# Patient Record
Sex: Female | Born: 1990 | Hispanic: Yes | Marital: Married | Smoking: Never smoker
Health system: Southern US, Community
[De-identification: ages and names within clinical notes are randomized; demographics above are authoritative.]

## PROBLEM LIST (undated history)

## (undated) DIAGNOSIS — D126 Benign neoplasm of colon, unspecified: Secondary | ICD-10-CM

## (undated) DIAGNOSIS — J329 Chronic sinusitis, unspecified: Secondary | ICD-10-CM

## (undated) DIAGNOSIS — F419 Anxiety disorder, unspecified: Secondary | ICD-10-CM

## (undated) DIAGNOSIS — J302 Other seasonal allergic rhinitis: Secondary | ICD-10-CM

## (undated) DIAGNOSIS — J45909 Unspecified asthma, uncomplicated: Secondary | ICD-10-CM

## (undated) DIAGNOSIS — B9681 Helicobacter pylori [H. pylori] as the cause of diseases classified elsewhere: Secondary | ICD-10-CM

## (undated) DIAGNOSIS — K219 Gastro-esophageal reflux disease without esophagitis: Secondary | ICD-10-CM

## (undated) DIAGNOSIS — T7840XA Allergy, unspecified, initial encounter: Secondary | ICD-10-CM

## (undated) HISTORY — PX: TONSILLECTOMY: SUR1361

## (undated) HISTORY — DX: Allergy, unspecified, initial encounter: T78.40XA

## (undated) HISTORY — DX: Benign neoplasm of colon, unspecified: D12.6

## (undated) HISTORY — DX: Anxiety disorder, unspecified: F41.9

## (undated) HISTORY — PX: POLYPECTOMY: SHX149

## (undated) HISTORY — DX: Helicobacter pylori (H. pylori) as the cause of diseases classified elsewhere: B96.81

## (undated) HISTORY — PX: COLONOSCOPY: SHX174

---
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
## 2011-01-27 ENCOUNTER — Encounter: Payer: Self-pay | Admitting: *Deleted

## 2011-01-27 ENCOUNTER — Emergency Department (INDEPENDENT_AMBULATORY_CARE_PROVIDER_SITE_OTHER)
Admission: EM | Admit: 2011-01-27 | Discharge: 2011-01-27 | Disposition: A | Discharge status: Home / Self Care | Payer: Medicaid Other | Source: Home / Self Care | Attending: Emergency Medicine | Admitting: Emergency Medicine

## 2011-01-27 DIAGNOSIS — H6690 Otitis media, unspecified, unspecified ear: Secondary | ICD-10-CM

## 2011-01-27 HISTORY — DX: Other seasonal allergic rhinitis: J30.2

## 2011-01-27 HISTORY — DX: Chronic sinusitis, unspecified: J32.9

## 2011-01-27 MED ORDER — AMOXICILLIN 500 MG PO CAPS: 1000.0000 mg | ORAL_CAPSULE | Freq: Three times a day (TID) | ORAL | Status: AC

## 2011-01-27 MED ORDER — IBUPROFEN 600 MG PO TABS: 600.0000 mg | ORAL_TABLET | Freq: Four times a day (QID) | ORAL | Status: AC | PRN

## 2011-01-27 MED ORDER — ANTIPYRINE-BENZOCAINE 5.4-1.4 % OT SOLN: 3.0000 [drp] | Freq: Four times a day (QID) | OTIC | Status: AC | PRN

## 2011-01-27 NOTE — ED Provider Notes (Signed)
History     CSN: [id number]  Arrival date & time 01/27/11  1738   First MD Initiated Contact with Patient 01/27/11 1855      Chief Complaint  Patient presents with  . Otalgia  . Nasal Congestion    (Consider location/radiation/quality/duration/timing/severity/associated sxs/prior treatment) Patient is a 21 y.o. female presenting with ear pain. The history is provided by the patient. No language interpreter was used.  Otalgia This is a new problem. The current episode started more than 2 days ago. There is pain in the left ear. The problem occurs constantly. The problem has been gradually worsening. The maximum temperature recorded prior to her arrival was 100 to 100.9 F. The fever has been present for less than 1 day. Associated symptoms include hearing loss, rhinorrhea and sore throat. Pertinent negatives include no ear discharge, no headaches, no abdominal pain, no diarrhea, no vomiting, no neck pain, no cough and no rash.    Past Medical History  Diagnosis Date  . Seasonal allergies   . Sinusitis     Past Surgical History  Procedure Date  . Cesarean section     History reviewed. No pertinent family history.  History  Substance Use Topics  . Smoking status: Never Smoker   . Smokeless tobacco: Not on file  . Alcohol Use: No    OB History    Grav Para Term Preterm Abortions TAB SAB Ect Mult Living                  Review of Systems  HENT: Positive for hearing loss, ear pain, sore throat and rhinorrhea. Negative for neck pain and ear discharge.   Respiratory: Negative for cough.   Gastrointestinal: Negative for vomiting, abdominal pain and diarrhea.  Skin: Negative for rash.  Neurological: Negative for headaches.    Allergies  Latex  Home Medications   Current Outpatient Rx  Name Route Sig Dispense Refill  . ACETAMINOPHEN 500 MG PO TABS Oral Take 500 mg by mouth every 6 (six) hours as needed.      Marland Kitchen PSEUDOEPHEDRINE HCL 30 MG PO TABS Oral Take 30 mg by  mouth every 4 (four) hours as needed.      . AMOXICILLIN 500 MG PO CAPS Oral Take 2 capsules (1,000 mg total) by mouth 3 (three) times daily. X 10 days 30 capsule 0  . ANTIPYRINE-BENZOCAINE 5.4-1.4 % OT SOLN Left Ear Place 3 drops into the left ear 4 (four) times daily as needed for pain. 10 mL 0  . IBUPROFEN 600 MG PO TABS Oral Take 1 tablet (600 mg total) by mouth every 6 (six) hours as needed for pain. 30 tablet 0    BP 142/68  Pulse 110  Temp(Src) 100.1 F (37.8 C) (Oral)  Resp 19  SpO2 100%  LMP 01/24/2011  Physical Exam  Nursing note and vitals reviewed. Constitutional: She is oriented to person, place, and time. She appears well-developed and well-nourished.  HENT:  Head: Normocephalic and atraumatic.  Right Ear: Ear canal normal. Tympanic membrane is bulging. No middle ear effusion.  Left Ear: There is tenderness. Tympanic membrane is erythematous and bulging. A middle ear effusion is present.  Nose: Mucosal edema and rhinorrhea present. No epistaxis.  Mouth/Throat: Uvula is midline, oropharynx is clear and moist and mucous membranes are normal.       (-) frontal, maxillary sinus tenderness  Eyes: Conjunctivae and EOM are normal. Pupils are equal, round, and reactive to light.  Neck: Normal range of motion. Neck  supple.  Cardiovascular: Normal rate, regular rhythm and normal heart sounds.   Pulmonary/Chest: Effort normal and breath sounds normal. No respiratory distress. She has no wheezes. She has no rales.  Abdominal: She exhibits no distension. There is no tenderness. There is no rebound and no guarding.  Musculoskeletal: Normal range of motion.  Lymphadenopathy:    She has cervical adenopathy.       Shotty LN bilaterally  Neurological: She is alert and oriented to person, place, and time.  Skin: Skin is warm and dry. No rash noted.  Psychiatric: She has a normal mood and affect. Her behavior is normal. Judgment and thought content normal.    ED Course  Procedures  (including critical care time)  Labs Reviewed - No data to display No results found.   1. Otitis media       MDM    Luiz Blare, MD 01/31/11 [phone]

## 2011-01-27 NOTE — ED Notes (Signed)
C/O nasal congestion x 5 days.  Started w/ left earache this AM.  Has been taking OTC congestion med without relief.

## 2011-03-28 ENCOUNTER — Emergency Department (HOSPITAL_COMMUNITY)
Admission: EM | Admit: 2011-03-28 | Discharge: 2011-03-29 | Disposition: A | Discharge status: Home / Self Care | Payer: Medicaid Other | Attending: Emergency Medicine | Admitting: Emergency Medicine

## 2011-03-28 ENCOUNTER — Encounter (HOSPITAL_COMMUNITY): Payer: Self-pay | Admitting: Emergency Medicine

## 2011-03-28 DIAGNOSIS — R7402 Elevation of levels of lactic acid dehydrogenase (LDH): Secondary | ICD-10-CM | POA: Insufficient documentation

## 2011-03-28 DIAGNOSIS — R7401 Elevation of levels of liver transaminase levels: Secondary | ICD-10-CM | POA: Insufficient documentation

## 2011-03-28 DIAGNOSIS — R110 Nausea: Secondary | ICD-10-CM | POA: Insufficient documentation

## 2011-03-28 DIAGNOSIS — R1013 Epigastric pain: Secondary | ICD-10-CM | POA: Insufficient documentation

## 2011-03-28 DIAGNOSIS — R748 Abnormal levels of other serum enzymes: Secondary | ICD-10-CM

## 2011-03-28 NOTE — ED Notes (Signed)
Patient complaining of epigastric/mid abdominal pain that started around 2000 this night.  Patient reports nausea and vomiting; denies diarrhea.  Last emesis 2200.  Patient states that the pain radiates to mid back.

## 2011-03-29 ENCOUNTER — Emergency Department (HOSPITAL_COMMUNITY): Payer: Medicaid Other

## 2011-03-29 LAB — DIFFERENTIAL
Basophils Absolute: 0 10*3/uL (ref 0.0–0.1)
Basophils Relative: 0 % (ref 0–1)
Eosinophils Absolute: 0 10*3/uL (ref 0.0–0.7)
Eosinophils Relative: 0 % (ref 0–5)
Lymphocytes Relative: 20 % (ref 12–46)
Lymphs Abs: 2.9 10*3/uL (ref 0.7–4.0)
Monocytes Absolute: 0.7 10*3/uL (ref 0.1–1.0)
Monocytes Relative: 5 % (ref 3–12)
Neutro Abs: 10.7 10*3/uL — ABNORMAL HIGH (ref 1.7–7.7)
Neutrophils Relative %: 75 % (ref 43–77)

## 2011-03-29 LAB — CBC
HCT: 40.3 % (ref 36.0–46.0)
Hemoglobin: 14.5 g/dL (ref 12.0–15.0)
MCH: 30 pg (ref 26.0–34.0)
MCHC: 36 g/dL (ref 30.0–36.0)
MCV: 83.4 fL (ref 78.0–100.0)
Platelets: 239 10*3/uL (ref 150–400)
RBC: 4.83 MIL/uL (ref 3.87–5.11)
RDW: 12.3 % (ref 11.5–15.5)
WBC: 14.3 10*3/uL — ABNORMAL HIGH (ref 4.0–10.5)

## 2011-03-29 LAB — COMPREHENSIVE METABOLIC PANEL
ALT: 320 U/L — ABNORMAL HIGH (ref 0–35)
AST: 446 U/L — ABNORMAL HIGH (ref 0–37)
Albumin: 4.4 g/dL (ref 3.5–5.2)
Alkaline Phosphatase: 82 U/L (ref 39–117)
BUN: 11 mg/dL (ref 6–23)
CO2: 29 mEq/L (ref 19–32)
Calcium: 10 mg/dL (ref 8.4–10.5)
Chloride: 101 mEq/L (ref 96–112)
Creatinine, Ser: 0.66 mg/dL (ref 0.50–1.10)
GFR calc Af Amer: >90 mL/min (ref >90)
GFR calc non Af Amer: >90 mL/min (ref >90)
Glucose, Bld: 143 mg/dL — ABNORMAL HIGH (ref 70–99)
Potassium: 3.3 mEq/L — ABNORMAL LOW (ref 3.5–5.1)
Sodium: 141 mEq/L (ref 135–145)
Total Bilirubin: 0.7 mg/dL (ref 0.3–1.2)
Total Protein: 8 g/dL (ref 6.0–8.3)

## 2011-03-29 LAB — URINE MICROSCOPIC-ADD ON

## 2011-03-29 LAB — POCT PREGNANCY, URINE: Preg Test, Ur: NEGATIVE

## 2011-03-29 LAB — URINALYSIS, ROUTINE W REFLEX MICROSCOPIC
Bilirubin Urine: NEGATIVE
Glucose, UA: NEGATIVE mg/dL
Hgb urine dipstick: NEGATIVE
Ketones, ur: NEGATIVE mg/dL
Leukocytes, UA: NEGATIVE
Nitrite: NEGATIVE
Protein, ur: 100 mg/dL — AB
Specific Gravity, Urine: 1.024 (ref 1.005–1.030)
Urobilinogen, UA: 1 mg/dL (ref 0.0–1.0)
pH: 8.5 — ABNORMAL HIGH (ref 5.0–8.0)

## 2011-03-29 LAB — LIPASE, BLOOD: Lipase: 32 U/L (ref 11–59)

## 2011-03-29 LAB — PREGNANCY, URINE: Preg Test, Ur: NEGATIVE

## 2011-03-29 MED ORDER — HYDROCODONE-ACETAMINOPHEN 5-500 MG PO TABS: 1.0000 | ORAL_TABLET | Freq: Four times a day (QID) | ORAL | Status: AC | PRN

## 2011-03-29 MED ORDER — ONDANSETRON HCL 4 MG/2ML IJ SOLN
4.0000 mg | Freq: Once | INTRAMUSCULAR | Status: AC
  Administered 2011-03-29: 4 mg via INTRAVENOUS

## 2011-03-29 MED ORDER — ONDANSETRON HCL 4 MG/2ML IJ SOLN
INTRAMUSCULAR | Status: AC
  Filled 2011-03-29: qty 2

## 2011-03-29 MED ORDER — SODIUM CHLORIDE 0.9 % IV SOLN
Freq: Once | INTRAVENOUS | Status: AC
  Administered 2011-03-29: 1000 mL via INTRAVENOUS

## 2011-03-29 MED ORDER — GI COCKTAIL ~~LOC~~
30.0000 mL | Freq: Once | ORAL | Status: DC
  Filled 2011-03-29: qty 30

## 2011-03-29 MED ORDER — MORPHINE SULFATE 4 MG/ML IJ SOLN
4.0000 mg | Freq: Once | INTRAMUSCULAR | Status: AC
  Administered 2011-03-29: 4 mg via INTRAVENOUS
  Filled 2011-03-29: qty 1

## 2011-03-29 MED ORDER — PROMETHAZINE HCL 25 MG PO TABS: 25.0000 mg | ORAL_TABLET | Freq: Four times a day (QID) | ORAL | Status: DC | PRN

## 2011-03-29 NOTE — ED Provider Notes (Signed)
History     CSN: [id number]  Arrival date & time 03/28/11  2345   First MD Initiated Contact with Patient 03/29/11 0001      Chief Complaint  Patient presents with  . Abdominal Pain    (Consider location/radiation/quality/duration/timing/severity/associated sxs/prior treatment) HPI Comments: Started at 8PM with epigastric pain, nausea.  No vomiting or fevers.  Had similar episode last week which resolved with vomiting.  Patient is a 21 y.o. female presenting with abdominal pain. The history is provided by the patient.  Abdominal Pain The primary symptoms of the illness include abdominal pain and nausea. The primary symptoms of the illness do not include fever, vomiting, diarrhea, dysuria, vaginal discharge or vaginal bleeding. The onset of the illness was sudden. The problem has not changed since onset. The patient states that she believes she is currently not pregnant. The patient has not had a change in bowel habit. Symptoms associated with the illness do not include chills, heartburn, urgency or frequency.    Past Medical History  Diagnosis Date  . Seasonal allergies   . Sinusitis     Past Surgical History  Procedure Date  . Cesarean section     History reviewed. No pertinent family history.  History  Substance Use Topics  . Smoking status: Never Smoker   . Smokeless tobacco: Not on file  . Alcohol Use: No    OB History    Grav Para Term Preterm Abortions TAB SAB Ect Mult Living                  Review of Systems  Constitutional: Negative for fever and chills.  Gastrointestinal: Positive for nausea and abdominal pain. Negative for heartburn, vomiting and diarrhea.  Genitourinary: Negative for dysuria, urgency, frequency, vaginal bleeding and vaginal discharge.  All other systems reviewed and are negative.    Allergies  Food and Latex  Home Medications  No current outpatient prescriptions on file.  BP 132/75  Pulse 79  Temp(Src) 97.7 F (36.5 C)  (Oral)  Resp 18  SpO2 98%  LMP 03/10/2011  Physical Exam  Nursing note and vitals reviewed. Constitutional: She is oriented to person, place, and time. She appears well-developed and well-nourished. No distress.  HENT:  Head: Normocephalic and atraumatic.  Neck: Normal range of motion. Neck supple.  Cardiovascular: Normal rate and regular rhythm.  Exam reveals no gallop and no friction rub.   No murmur heard. Pulmonary/Chest: Effort normal and breath sounds normal. No respiratory distress. She has no wheezes.  Abdominal: Soft. Bowel sounds are normal. She exhibits no distension. There is tenderness.       There is mild ttp in the epigastrium with no rebound or guarding.    Musculoskeletal: Normal range of motion.  Neurological: She is alert and oriented to person, place, and time.  Skin: Skin is warm and dry. She is not diaphoretic.    ED Course  Procedures (including critical care time)   Labs Reviewed  CBC  DIFFERENTIAL  COMPREHENSIVE METABOLIC PANEL  LIPASE, BLOOD  URINALYSIS, ROUTINE W REFLEX MICROSCOPIC  PREGNANCY, URINE   No results found.   No diagnosis found.    MDM  The workup reveals elevated liver enzymes of unknown significance.  The ultrasound does not show a stone and she adamantly denies having started any new medicines.  She feels better with meds given.  I don't believe there is any emergent cause for admission.  I will discharge her with pain meds, time, and follow up with  her pcp.          Geoffery Lyons, MD 03/29/11 [phone]

## 2011-09-10 LAB — OB RESULTS CONSOLE GC/CHLAMYDIA
Chlamydia: NEGATIVE
Gonorrhea: NEGATIVE

## 2011-09-10 LAB — OB RESULTS CONSOLE HEPATITIS B SURFACE ANTIGEN: Hepatitis B Surface Ag: NEGATIVE

## 2011-09-10 LAB — OB RESULTS CONSOLE ABO/RH: RH Type: POSITIVE

## 2011-09-10 LAB — OB RESULTS CONSOLE HIV ANTIBODY (ROUTINE TESTING): HIV: NONREACTIVE

## 2011-09-10 LAB — OB RESULTS CONSOLE RUBELLA ANTIBODY, IGM: Rubella: UNDETERMINED

## 2011-09-10 LAB — OB RESULTS CONSOLE ANTIBODY SCREEN: Antibody Screen: NEGATIVE

## 2011-09-10 LAB — OB RESULTS CONSOLE RPR: RPR: NONREACTIVE

## 2012-01-24 ENCOUNTER — Encounter (HOSPITAL_COMMUNITY): Payer: Self-pay | Admitting: Emergency Medicine

## 2012-01-24 ENCOUNTER — Emergency Department (INDEPENDENT_AMBULATORY_CARE_PROVIDER_SITE_OTHER)
Admission: EM | Admit: 2012-01-24 | Discharge: 2012-01-24 | Disposition: A | Discharge status: Home / Self Care | Payer: Medicaid Other | Source: Home / Self Care | Attending: Emergency Medicine | Admitting: Emergency Medicine

## 2012-01-24 DIAGNOSIS — H6692 Otitis media, unspecified, left ear: Secondary | ICD-10-CM

## 2012-01-24 DIAGNOSIS — J45909 Unspecified asthma, uncomplicated: Secondary | ICD-10-CM

## 2012-01-24 DIAGNOSIS — H6690 Otitis media, unspecified, unspecified ear: Secondary | ICD-10-CM

## 2012-01-24 DIAGNOSIS — J069 Acute upper respiratory infection, unspecified: Secondary | ICD-10-CM

## 2012-01-24 DIAGNOSIS — Z3490 Encounter for supervision of normal pregnancy, unspecified, unspecified trimester: Secondary | ICD-10-CM

## 2012-01-24 MED ORDER — ALBUTEROL SULFATE (5 MG/ML) 0.5% IN NEBU
INHALATION_SOLUTION | RESPIRATORY_TRACT | Status: AC
  Filled 2012-01-24: qty 0.5

## 2012-01-24 MED ORDER — AMOXICILLIN 500 MG PO CAPS: 1000.0000 mg | ORAL_CAPSULE | Freq: Two times a day (BID) | ORAL | Status: DC

## 2012-01-24 MED ORDER — ALBUTEROL SULFATE (5 MG/ML) 0.5% IN NEBU
2.5000 mg | INHALATION_SOLUTION | Freq: Once | RESPIRATORY_TRACT | Status: AC
  Administered 2012-01-24: 2.5 mg via RESPIRATORY_TRACT

## 2012-01-24 MED ORDER — ALBUTEROL SULFATE HFA 108 (90 BASE) MCG/ACT IN AERS: 1.0000 | INHALATION_SPRAY | Freq: Four times a day (QID) | RESPIRATORY_TRACT | Status: DC | PRN

## 2012-01-24 NOTE — ED Notes (Signed)
Pt c//o productive cough with yellow/green sputum and back pain from coughing. Bilateral ear pain, denies drainage. Pt denies any other symptoms. Pt is currently pregnant.  Sinus congestion. -pt has not tried any otc meds for treatment.

## 2012-01-24 NOTE — ED Provider Notes (Signed)
History     CSN: [id number]  Arrival date & time 01/24/12  1348   First MD Initiated Contact with Patient 01/24/12 1543      Chief Complaint  Patient presents with  . URI    bilateral ear pain productive cough with yellow/green sputum. back pain with cough.     (Consider location/radiation/quality/duration/timing/severity/associated sxs/prior treatment) HPI Comments: 21 year old female who is 7 months pregnant complains of ears being stopped up and pain in the left ear especially with coughing. When she does cough it makes her throat hurt. She has PND but no fever or vomiting. Her chief complaint is cough and left earache. Denies shortness of breath or chest pain or abdominal pain. Denies pelvic cramps, pain or vaginal bleeding the   Past Medical History  Diagnosis Date  . Seasonal allergies   . Sinusitis     Past Surgical History  Procedure Date  . Cesarean section     History reviewed. No pertinent family history.  History  Substance Use Topics  . Smoking status: Never Smoker   . Smokeless tobacco: Not on file  . Alcohol Use: No    OB History    Grav Para Term Preterm Abortions TAB SAB Ect Mult Living   1               Review of Systems  Constitutional: Negative for fever, chills, activity change, appetite change and fatigue.  HENT: Positive for ear pain, congestion, rhinorrhea and postnasal drip. Negative for facial swelling, neck pain and neck stiffness.   Eyes: Negative.   Respiratory: Positive for cough.   Cardiovascular: Negative.   Gastrointestinal: Negative.   Skin: Negative for pallor and rash.  Neurological: Negative.   Psychiatric/Behavioral: Negative.     Allergies  Food and Latex  Home Medications   Current Outpatient Rx  Name  Route  Sig  Dispense  Refill  . VITAMINS C E PO   Oral   Take by mouth.         . ALBUTEROL SULFATE HFA 108 (90 BASE) MCG/ACT IN AERS   Inhalation   Inhale 1-2 puffs into the lungs every 6 (six) hours as  needed for wheezing.   1 Inhaler   0   . AMOXICILLIN 500 MG PO CAPS   Oral   Take 2 capsules (1,000 mg total) by mouth 2 (two) times daily.   40 capsule   0   . PROMETHAZINE HCL 25 MG PO TABS   Oral   Take 1 tablet (25 mg total) by mouth every 6 (six) hours as needed for nausea.   10 tablet   0     BP 134/75  Pulse 94  Temp 98 F (36.7 C) (Oral)  Resp 19  SpO2 96%  LMP 03/10/2011  Physical Exam  Nursing note and vitals reviewed. Constitutional: She is oriented to person, place, and time. She appears well-developed and well-nourished. No distress.  HENT:       Right TM normal; left TM with erythema and bulging. Oropharynx with mild erythema, clear PND, no exudates.  Eyes: Conjunctivae normal and EOM are normal.  Neck: Normal range of motion. Neck supple.  Cardiovascular: Normal rate and regular rhythm.   Pulmonary/Chest: Effort normal. No respiratory distress. She has wheezes.       Wheezes are not heard with normal respirations at rest. With deep breaths and cough and expiratory wheezes produced. No Rales or rhonchi.  Musculoskeletal: Normal range of motion. She exhibits no edema.  Lymphadenopathy:    She has no cervical adenopathy.  Neurological: She is alert and oriented to person, place, and time. She exhibits normal muscle tone.  Skin: Skin is warm and dry. No rash noted.  Psychiatric: She has a normal mood and affect.    ED Course  Procedures (including critical care time)  Labs Reviewed - No data to display No results found.   1. URI (upper respiratory infection)   2. RAD (reactive airway disease) with wheezing   3. Otitis media, left   4. Pregnancy       MDM  And albuterol pediatric dose per nebulizer was administered in the office. Post neb evaluation reveals a marked decrease in coarseness and wheezing and she is feeling and breathing much better. She will be treated with an albuterol HFA inhaler 1-2 puffs every 4-6 hours when necessary cough and  wheeze. Amoxicillin 1000 mg twice a day for 10 days for her ear infection For drainage Allegra or Claritin as directed And copious, frequent use of saline nasal spray. She is to followup with her gynecologist as needed and for any worsening new symptoms or problems especially the development of shortness of breath fever worsening cough may return . She is discharged in stable in overall improved condition.        Hayden Rasmussen, NP 01/24/12 1713  Hayden Rasmussen, NP 01/24/12 1714

## 2012-01-24 NOTE — ED Provider Notes (Signed)
Medical screening examination/treatment/procedure(s) were performed by non-physician practitioner and as supervising physician I was immediately available for consultation/collaboration.  Leslee Home, M.D.   Reuben Likes, MD 01/24/12 [phone]

## 2012-02-25 LAB — OB RESULTS CONSOLE GBS: GBS: NEGATIVE

## 2012-03-27 ENCOUNTER — Inpatient Hospital Stay (HOSPITAL_COMMUNITY)
Admission: AD | Admit: 2012-03-27 | Discharge: 2012-03-27 | Disposition: A | Discharge status: Home / Self Care | Payer: Medicaid Other | Source: Ambulatory Visit | Attending: Obstetrics and Gynecology | Admitting: Obstetrics and Gynecology

## 2012-03-27 ENCOUNTER — Encounter (HOSPITAL_COMMUNITY): Payer: Self-pay | Admitting: Obstetrics and Gynecology

## 2012-03-27 DIAGNOSIS — Z3689 Encounter for other specified antenatal screening: Secondary | ICD-10-CM

## 2012-03-27 DIAGNOSIS — O368190 Decreased fetal movements, unspecified trimester, not applicable or unspecified: Secondary | ICD-10-CM | POA: Insufficient documentation

## 2012-03-27 DIAGNOSIS — O368130 Decreased fetal movements, third trimester, not applicable or unspecified: Secondary | ICD-10-CM

## 2012-03-27 HISTORY — DX: Unspecified asthma, uncomplicated: J45.909

## 2012-03-27 NOTE — MAU Note (Signed)
"  I haven't felt the baby move since about 2230 last night.  I drank something really cold to make the baby move and none of that worked.  I called Dr. Ambrose Mantle this morning and he told me to come over here.  No VB or LOF."

## 2012-03-27 NOTE — MAU Note (Signed)
Andrea Lang is here today with decreased fetal moment. She is 40 weeks and 2 days; last felt her baby move this morning; however 2 movements in several hours is all she has felt.

## 2012-03-27 NOTE — MAU Provider Note (Signed)
History     CSN: [id number]  Arrival date and time: 03/27/12 1122   First Provider Initiated Contact with Patient 03/27/12 1148      Chief Complaint  Patient presents with  . Decreased Fetal Movement   HPI 22 y.o. [id number] at [redacted]w[redacted]d with decreased fetal movement. Last felt movement earlier this morning. No pain, bleeding, LOF. H/O c-section - planning TOLAC.   Past Medical History  Diagnosis Date  . Seasonal allergies   . Sinusitis   . Asthma     no real dx; but requires use of an inhaler for occassional SOB    Past Surgical History  Procedure Laterality Date  . Cesarean section      History reviewed. No pertinent family history.  History  Substance Use Topics  . Smoking status: Never Smoker   . Smokeless tobacco: Not on file  . Alcohol Use: No    Allergies:  Allergies  Allergen Reactions  . Food Swelling    Mango,banana, & avacodo   Swelling of the lips  . Latex Rash    Prescriptions prior to admission  Medication Sig Dispense Refill  . albuterol (PROVENTIL HFA;VENTOLIN HFA) 108 (90 BASE) MCG/ACT inhaler Inhale 1-2 puffs into the lungs every 6 (six) hours as needed for wheezing.  1 Inhaler  0  . calcium carbonate (TUMS - DOSED IN MG ELEMENTAL CALCIUM) 500 MG chewable tablet Chew 1 tablet by mouth daily as needed for heartburn.      . Prenatal Vit-Fe Fumarate-FA (PRENATAL MULTIVITAMIN) TABS Take 1 tablet by mouth daily at 12 noon.        Review of Systems  Constitutional: Negative.   Respiratory: Negative.   Cardiovascular: Negative.   Gastrointestinal: Negative for nausea, vomiting, abdominal pain, diarrhea and constipation.  Genitourinary: Negative for dysuria, urgency, frequency, hematuria and flank pain.       Negative for vaginal bleeding, cramping/contractions  Musculoskeletal: Negative.   Neurological: Negative.   Psychiatric/Behavioral: Negative.    Physical Exam   Blood pressure 113/69, pulse 111, temperature 97.6 F (36.4 C), temperature  source Oral, resp. rate 18, last menstrual period 03/10/2011.  Physical Exam  Nursing note and vitals reviewed. Constitutional: She is oriented to person, place, and time. She appears well-developed and well-nourished. No distress.  Cardiovascular: Normal rate.   Respiratory: Effort normal.  GI: Soft. There is no tenderness.  Musculoskeletal: Normal range of motion.  Neurological: She is alert and oriented to person, place, and time.  Skin: Skin is warm and dry.  Psychiatric: She has a normal mood and affect.    MAU Course  Procedures NST reactive, TOCO: irritability, rare UC  Assessment and Plan   1. Decreased fetal movement, third trimester, not applicable or unspecified fetus   2. NST (non-stress test) reactive       Medication List    TAKE these medications       albuterol 108 (90 BASE) MCG/ACT inhaler  Commonly known as:  PROVENTIL HFA;VENTOLIN HFA  Inhale 1-2 puffs into the lungs every 6 (six) hours as needed for wheezing.     calcium carbonate 500 MG chewable tablet  Commonly known as:  TUMS - dosed in mg elemental calcium  Chew 1 tablet by mouth daily as needed for heartburn.     prenatal multivitamin Tabs  Take 1 tablet by mouth daily at 12 noon.            Follow-up Information   Follow up with Bing Plume, MD. (as scheduled)  Contact information:   [address], [address], [address] [place] [address] [phone]         FRAZIER,NATALIE 03/27/2012, 12:12 PM

## 2012-03-31 ENCOUNTER — Encounter (HOSPITAL_COMMUNITY): Payer: Self-pay | Admitting: Anesthesiology

## 2012-03-31 ENCOUNTER — Encounter (HOSPITAL_COMMUNITY): Payer: Self-pay | Admitting: *Deleted

## 2012-03-31 ENCOUNTER — Inpatient Hospital Stay (HOSPITAL_COMMUNITY)
Admission: AD | Admit: 2012-03-31 | Discharge: 2012-04-02 | DRG: 775 | Disposition: A | Discharge status: Home / Self Care | Payer: Medicaid Other | Source: Ambulatory Visit | Attending: Obstetrics and Gynecology | Admitting: Obstetrics and Gynecology

## 2012-03-31 ENCOUNTER — Encounter (HOSPITAL_COMMUNITY)
Admission: AD | Disposition: A | Discharge status: Home / Self Care | Payer: Self-pay | Source: Ambulatory Visit | Attending: Obstetrics and Gynecology

## 2012-03-31 ENCOUNTER — Inpatient Hospital Stay (HOSPITAL_COMMUNITY)
Admission: RE | Admit: 2012-03-31 | Payer: Medicaid Other | Source: Ambulatory Visit | Admitting: Obstetrics and Gynecology

## 2012-03-31 ENCOUNTER — Inpatient Hospital Stay (HOSPITAL_COMMUNITY): Payer: Medicaid Other | Admitting: Anesthesiology

## 2012-03-31 DIAGNOSIS — M4120 Other idiopathic scoliosis, site unspecified: Secondary | ICD-10-CM | POA: Diagnosis present

## 2012-03-31 DIAGNOSIS — O4100X0 Oligohydramnios, unspecified trimester, not applicable or unspecified: Secondary | ICD-10-CM | POA: Diagnosis present

## 2012-03-31 DIAGNOSIS — O99892 Other specified diseases and conditions complicating childbirth: Secondary | ICD-10-CM | POA: Diagnosis present

## 2012-03-31 DIAGNOSIS — O34219 Maternal care for unspecified type scar from previous cesarean delivery: Secondary | ICD-10-CM | POA: Diagnosis present

## 2012-03-31 LAB — CBC
HCT: 40.1 % (ref 36.0–46.0)
Hemoglobin: 13.9 g/dL (ref 12.0–15.0)
MCH: 30.2 pg (ref 26.0–34.0)
MCHC: 34.7 g/dL (ref 30.0–36.0)
MCV: 87.2 fL (ref 78.0–100.0)
Platelets: 176 10*3/uL (ref 150–400)
RBC: 4.6 MIL/uL (ref 3.87–5.11)
RDW: 13.4 % (ref 11.5–15.5)
WBC: 10.9 10*3/uL — ABNORMAL HIGH (ref 4.0–10.5)

## 2012-03-31 LAB — RPR: RPR Ser Ql: NONREACTIVE

## 2012-03-31 LAB — PREPARE RBC (CROSSMATCH)

## 2012-03-31 SURGERY — Surgical Case
Anesthesia: Spinal

## 2012-03-31 MED ORDER — DEXTROSE 5 % IV SOLN
5.0000 10*6.[IU] | Freq: Once | INTRAVENOUS | Status: DC
  Filled 2012-03-31: qty 5

## 2012-03-31 MED ORDER — TETANUS-DIPHTH-ACELL PERTUSSIS 5-2.5-18.5 LF-MCG/0.5 IM SUSP: 0.5000 mL | Freq: Once | INTRAMUSCULAR | Status: DC

## 2012-03-31 MED ORDER — LACTATED RINGERS IV SOLN
500.0000 mL | Freq: Once | INTRAVENOUS | Status: AC
  Administered 2012-03-31: 500 mL via INTRAVENOUS

## 2012-03-31 MED ORDER — LACTATED RINGERS IV SOLN: INTRAVENOUS | Status: AC

## 2012-03-31 MED ORDER — SENNOSIDES-DOCUSATE SODIUM 8.6-50 MG PO TABS
2.0000 | ORAL_TABLET | Freq: Every day | ORAL | Status: DC
  Administered 2012-03-31: 2 via ORAL

## 2012-03-31 MED ORDER — PRENATAL MULTIVITAMIN CH: 1.0000 | ORAL_TABLET | Freq: Every day | ORAL | Status: DC

## 2012-03-31 MED ORDER — CITRIC ACID-SODIUM CITRATE 334-500 MG/5ML PO SOLN
ORAL | Status: AC
  Filled 2012-03-31: qty 15

## 2012-03-31 MED ORDER — SIMETHICONE 80 MG PO CHEW: 80.0000 mg | CHEWABLE_TABLET | ORAL | Status: DC | PRN

## 2012-03-31 MED ORDER — PRENATAL MULTIVITAMIN CH
1.0000 | ORAL_TABLET | Freq: Every day | ORAL | Status: DC
  Administered 2012-04-01 – 2012-04-02 (×2): 1 via ORAL
  Filled 2012-03-31: qty 1

## 2012-03-31 MED ORDER — LANOLIN HYDROUS EX OINT: TOPICAL_OINTMENT | CUTANEOUS | Status: DC | PRN

## 2012-03-31 MED ORDER — ONDANSETRON HCL 4 MG/2ML IJ SOLN: 4.0000 mg | INTRAMUSCULAR | Status: DC | PRN

## 2012-03-31 MED ORDER — DIPHENHYDRAMINE HCL 25 MG PO CAPS: 25.0000 mg | ORAL_CAPSULE | Freq: Four times a day (QID) | ORAL | Status: DC | PRN

## 2012-03-31 MED ORDER — FENTANYL 2.5 MCG/ML BUPIVACAINE 1/10 % EPIDURAL INFUSION (WH - ANES)
14.0000 mL/h | INTRAMUSCULAR | Status: DC
  Filled 2012-03-31: qty 125

## 2012-03-31 MED ORDER — OXYCODONE-ACETAMINOPHEN 5-325 MG PO TABS: 1.0000 | ORAL_TABLET | ORAL | Status: DC | PRN

## 2012-03-31 MED ORDER — DIBUCAINE 1 % RE OINT: 1.0000 "application " | TOPICAL_OINTMENT | RECTAL | Status: DC | PRN

## 2012-03-31 MED ORDER — WITCH HAZEL-GLYCERIN EX PADS: 1.0000 "application " | MEDICATED_PAD | CUTANEOUS | Status: DC | PRN

## 2012-03-31 MED ORDER — BENZOCAINE-MENTHOL 20-0.5 % EX AERO
1.0000 "application " | INHALATION_SPRAY | CUTANEOUS | Status: DC | PRN
  Administered 2012-04-02: 1 via TOPICAL
  Filled 2012-03-31: qty 56

## 2012-03-31 MED ORDER — SCOPOLAMINE 1 MG/3DAYS TD PT72
MEDICATED_PATCH | TRANSDERMAL | Status: AC
  Filled 2012-03-31: qty 1

## 2012-03-31 MED ORDER — IBUPROFEN 600 MG PO TABS: 600.0000 mg | ORAL_TABLET | Freq: Four times a day (QID) | ORAL | Status: DC | PRN

## 2012-03-31 MED ORDER — ONDANSETRON HCL 4 MG PO TABS: 4.0000 mg | ORAL_TABLET | ORAL | Status: DC | PRN

## 2012-03-31 MED ORDER — PHENYLEPHRINE 40 MCG/ML (10ML) SYRINGE FOR IV PUSH (FOR BLOOD PRESSURE SUPPORT): 80.0000 ug | PREFILLED_SYRINGE | INTRAVENOUS | Status: DC | PRN

## 2012-03-31 MED ORDER — ALBUTEROL SULFATE HFA 108 (90 BASE) MCG/ACT IN AERS: 1.0000 | INHALATION_SPRAY | Freq: Four times a day (QID) | RESPIRATORY_TRACT | Status: DC | PRN

## 2012-03-31 MED ORDER — MEASLES, MUMPS & RUBELLA VAC ~~LOC~~ INJ
0.5000 mL | INJECTION | Freq: Once | SUBCUTANEOUS | Status: AC
  Administered 2012-04-02: 0.5 mL via SUBCUTANEOUS
  Filled 2012-03-31: qty 0.5

## 2012-03-31 MED ORDER — OXYTOCIN BOLUS FROM INFUSION
500.0000 mL | INTRAVENOUS | Status: DC
  Administered 2012-03-31: 500 mL via INTRAVENOUS

## 2012-03-31 MED ORDER — LIDOCAINE HCL (PF) 1 % IJ SOLN
30.0000 mL | INTRAMUSCULAR | Status: DC | PRN
  Administered 2012-03-31: 30 mL via SUBCUTANEOUS
  Filled 2012-03-31 (×2): qty 30

## 2012-03-31 MED ORDER — LACTATED RINGERS IV SOLN
INTRAVENOUS | Status: DC
  Administered 2012-03-31 (×2): via INTRAVENOUS

## 2012-03-31 MED ORDER — ZOLPIDEM TARTRATE 5 MG PO TABS: 5.0000 mg | ORAL_TABLET | Freq: Every evening | ORAL | Status: DC | PRN

## 2012-03-31 MED ORDER — OXYTOCIN 40 UNITS IN LACTATED RINGERS INFUSION - SIMPLE MED
62.5000 mL/h | INTRAVENOUS | Status: DC
  Administered 2012-03-31: 62.5 mL/h via INTRAVENOUS
  Filled 2012-03-31: qty 1000

## 2012-03-31 MED ORDER — LACTATED RINGERS IV SOLN: 500.0000 mL | INTRAVENOUS | Status: DC | PRN

## 2012-03-31 MED ORDER — IBUPROFEN 600 MG PO TABS
600.0000 mg | ORAL_TABLET | Freq: Four times a day (QID) | ORAL | Status: DC
  Administered 2012-03-31 – 2012-04-02 (×7): 600 mg via ORAL
  Filled 2012-03-31 (×9): qty 1

## 2012-03-31 MED ORDER — PENICILLIN G POTASSIUM 5000000 UNITS IJ SOLR
2.5000 10*6.[IU] | INTRAVENOUS | Status: DC
  Filled 2012-03-31 (×4): qty 2.5

## 2012-03-31 MED ORDER — PHENYLEPHRINE 40 MCG/ML (10ML) SYRINGE FOR IV PUSH (FOR BLOOD PRESSURE SUPPORT)
80.0000 ug | PREFILLED_SYRINGE | INTRAVENOUS | Status: DC | PRN
  Filled 2012-03-31: qty 5

## 2012-03-31 MED ORDER — CALCIUM CARBONATE ANTACID 500 MG PO CHEW: 400.0000 mg | CHEWABLE_TABLET | Freq: Every day | ORAL | Status: DC | PRN

## 2012-03-31 MED ORDER — EPHEDRINE 5 MG/ML INJ: 10.0000 mg | INTRAVENOUS | Status: DC | PRN

## 2012-03-31 MED ORDER — OXYCODONE-ACETAMINOPHEN 5-325 MG PO TABS
1.0000 | ORAL_TABLET | ORAL | Status: DC | PRN
  Administered 2012-04-01: 1 via ORAL
  Filled 2012-03-31: qty 1

## 2012-03-31 MED ORDER — OXYTOCIN 40 UNITS IN LACTATED RINGERS INFUSION - SIMPLE MED: 62.5000 mL/h | INTRAVENOUS | Status: AC | PRN

## 2012-03-31 MED ORDER — FENTANYL 2.5 MCG/ML BUPIVACAINE 1/10 % EPIDURAL INFUSION (WH - ANES)
INTRAMUSCULAR | Status: DC | PRN
  Administered 2012-03-31: 14 mL/h via EPIDURAL

## 2012-03-31 MED ORDER — DIPHENHYDRAMINE HCL 50 MG/ML IJ SOLN: 12.5000 mg | INTRAMUSCULAR | Status: DC | PRN

## 2012-03-31 MED ORDER — LIDOCAINE HCL (PF) 1 % IJ SOLN
INTRAMUSCULAR | Status: DC | PRN
  Administered 2012-03-31 (×2): 4 mL

## 2012-03-31 MED ORDER — EPHEDRINE 5 MG/ML INJ
10.0000 mg | INTRAVENOUS | Status: DC | PRN
  Filled 2012-03-31: qty 4

## 2012-03-31 NOTE — Anesthesia Procedure Notes (Signed)
Epidural Patient location during procedure: OB Start time: 03/31/2012 4:57 PM  Staffing Anesthesiologist: FOSTER, MICHAEL A. Performed by: anesthesiologist   Preanesthetic Checklist Completed: patient identified, site marked, surgical consent, pre-op evaluation, timeout performed, IV checked, risks and benefits discussed and monitors and equipment checked  Epidural Patient position: sitting Prep: site prepped and draped and DuraPrep Patient monitoring: continuous pulse ox and blood pressure Approach: midline Injection technique: LOR air  Needle:  Needle type: Tuohy  Needle gauge: 17 G Needle length: 9 cm and 9 Needle insertion depth: 5 cm cm Catheter type: closed end flexible Catheter size: 19 Gauge Catheter at skin depth: 10 cm Test dose: negative and Other  Assessment Events: blood not aspirated, injection not painful, no injection resistance, negative IV test and no paresthesia  Additional Notes Patient identified. Risks and benefits discussed including failed block, incomplete  Pain control, post dural puncture headache, nerve damage, paralysis, blood pressure Changes, nausea, vomiting, reactions to medications-both toxic and allergic and post Partum back pain. All questions were answered. Patient expressed understanding and wished to proceed. Sterile technique was used throughout procedure. Epidural site was Dressed with sterile barrier dressing. No paresthesias, signs of intravascular injection Or signs of intrathecal spread were encountered.  Patient was more comfortable after the epidural was dosed. Please see RN's note for documentation of vital signs and FHR which are stable.

## 2012-03-31 NOTE — Progress Notes (Signed)
Patient ID: Andrea Lang, female   DOB: 06/29/1990, 21 y.o.   MRN: [id number] Pt is contracting without discomfort. Cervix 4-5 cm 70 % effaced and the vertex is at - 2 station. AROM produced clear fluid.

## 2012-03-31 NOTE — Op Note (Signed)
Pt pushed well and delivered spontaneously ROA over an intact perineum a living female infant with Apgars of 9 and 9 at 1 and 5 minutes. The placenta was delivered intact and the uterus was normal. There were bilateral labial lacerations, the left was hemostatic and not repaired, the right extended from the introitus at 9 o'clock superiorly to the level of the clitoris. It was bleeding and repaired with 3-0 vicryl under local block. EBL 400 cc's.

## 2012-03-31 NOTE — Anesthesia Preprocedure Evaluation (Addendum)
Anesthesia Evaluation  Patient identified by MRN, date of birth, ID band Patient awake    Reviewed: Allergy & Precautions, H&P , Patient's Chart, lab work & pertinent test results  Airway Mallampati: III TM Distance: >3 FB Neck ROM: full    Dental no notable dental hx. (+) Teeth Intact   Pulmonary neg pulmonary ROS, asthma ,  breath sounds clear to auscultation  Pulmonary exam normal       Cardiovascular negative cardio ROS  Rhythm:regular Rate:Normal     Neuro/Psych negative neurological ROS  negative psych ROS   GI/Hepatic negative GI ROS, Neg liver ROS,   Endo/Other  Morbid obesity  Renal/GU negative Renal ROS  negative genitourinary   Musculoskeletal   Abdominal Normal abdominal exam  (+)   Peds  Hematology negative hematology ROS (+)   Anesthesia Other Findings   Reproductive/Obstetrics (+) Pregnancy Previous C/Section                          Anesthesia Physical Anesthesia Plan  ASA: II  Anesthesia Plan: Epidural   Post-op Pain Management:    Induction:   Airway Management Planned:   Additional Equipment:   Intra-op Plan:   Post-operative Plan:   Informed Consent: I have reviewed the patients History and Physical, chart, labs and discussed the procedure including the risks, benefits and alternatives for the proposed anesthesia with the patient or authorized representative who has indicated his/her understanding and acceptance.     Plan Discussed with: Anesthesiologist  Anesthesia Plan Comments:         Anesthesia Quick Evaluation

## 2012-03-31 NOTE — H&P (Signed)
Andrea Lang, SCHEIDEGGER                    ACCOUNT NO.:  [id number]  MEDICAL RECORD NO.:  [id number]  LOCATION:  9170                          FACILITY:  [facility]  PHYSICIAN:  Malachi Pro. Ambrose Mantle, M.D. DATE OF BIRTH:  Aug 23, 1990  DATE OF ADMISSION:  03/31/2012 DATE OF DISCHARGE:                             HISTORY & PHYSICAL   HISTORY OF PRESENT ILLNESS:  This is a 22 year old Hispanic female para 1-0-2-1, gravida 4, presents for ruptured membranes for induction of labor.  EDC; March 25, 2012.  Blood group and type A positive, negative antibody.  Pap smear not available.  Rubella equivocal.  RPR nonreactive.  Urine culture negative.  Hepatitis B surface antigen negative, HIV negative.  GC and Chlamydia negative.  First trimester screen negative.  Cystic fibrosis screen declined.  AFP negative.  One hour Glucola 102, group B strep negative.  The patient began her prenatal course at our office on September 10, 2011, at 11 weeks and 6 days by an ultrasound done at 10 weeks on August 28, 2011.  The patient's prenatal course was complicated by BMI of greater than 30, history of greater than 4000 g infant and previous C-section with the uterus closed in 1 layer.  Rubella equivocal.  The patient continued in her desire for vaginal birth after cesarean and I informed her from the outset that at 41 weeks, we would need to deliver the baby, but I would hesitate to use Pitocin because of her single-layer closure.  At her visit prior to today, her cervix was 2 cm.  On today's visit, she came for a nonstress test which was nonreactive, a biophysical profile was normal except for an AFI of less than 5 cm.  On exam, her cervix was 4 cm, even though she was not in labor.  I advised her that I would rupture her membranes and see if labor ensued, but I was not certain that I would give her Pitocin.  PAST MEDICAL HISTORY:  She does have scoliosis of her mid thoracic spine.  She had spontaneous abortions at 6 weeks  and 8 weeks.  She has had a history of trichomonas vaginitis.  SURGICAL HISTORY:  C-section in 2009, closed with 1 layer, tonsillectomy in first grade.  ALLERGIES:  Avocados, bananas, kiwi, mango, melon, and latex.  She has no drug allergies.  FAMILY MEDICAL HISTORY:  Father has asthma, uncle has asthma, paternal grandmother with hypertension.  OBSTETRIC HISTORY:  Two spontaneous abortions and a C-section for a 9 pounds 4 ounce female infant in 2009.  That C-section was done for face presentation and fetal distress.  SOCIAL HISTORY:  She formerly used alcohol.  She denies illicit substance abuse.  She denies tobacco.  She states she is in college at [employer].  PHYSICAL EXAMINATION:  VITAL SIGNS:  Blood pressure and pulse are normal. HEART:  Normal size and sounds.  No murmurs. ABDOMEN:  Soft.  Fundal height 38 cm.  Fetal heart tones normal.  Cervix 4 cm.  ADMITTING IMPRESSION:  Intrauterine pregnancy at 40 weeks and 6 days, prior C-section with single-layer closure, oligohydramnios.  The patient is admitted for rupture of membranes, to see if  labor will ensue.     Malachi Pro. Ambrose Mantle, M.D.     TFH/MEDQ  D:  03/31/2012  T:  03/31/2012  Job:  [id number]

## 2012-03-31 NOTE — Progress Notes (Signed)
Patient ID: Andrea Lang, female   DOB: 07/29/1990, 21 y.o.   MRN: [id number] The pt went into labor and received an epidural which has given partial relief The cervix was fully dilated at 5:30 PM and the pt will push on her side.

## 2012-04-01 LAB — CBC
HCT: 34.6 % — ABNORMAL LOW (ref 36.0–46.0)
Hemoglobin: 11.8 g/dL — ABNORMAL LOW (ref 12.0–15.0)
MCH: 29.8 pg (ref 26.0–34.0)
MCHC: 34.1 g/dL (ref 30.0–36.0)
MCV: 87.4 fL (ref 78.0–100.0)
Platelets: 157 10*3/uL (ref 150–400)
RBC: 3.96 MIL/uL (ref 3.87–5.11)
RDW: 13.4 % (ref 11.5–15.5)
WBC: 15.6 10*3/uL — ABNORMAL HIGH (ref 4.0–10.5)

## 2012-04-01 NOTE — Anesthesia Postprocedure Evaluation (Signed)
Anesthesia Post Note  Patient: Andrea Lang  Procedure(s) Performed: * No procedures listed *  Anesthesia type: Epidural  Patient location: Mother/Baby  Post pain: Pain level controlled  Post assessment: Post-op Vital signs reviewed  Last Vitals:  Filed Vitals:   04/01/12 0200  BP: 111/63  Pulse: 81  Temp: 36.6 C  Resp: 20    Post vital signs: Reviewed  Level of consciousness:alert  Complications: No apparent anesthesia complications

## 2012-04-01 NOTE — Progress Notes (Signed)
Patient ID: Andrea Lang, female   DOB: 1990-06-09, 21 y.o.   MRN: [id number] #1 afebrile BP normal HGB stable no complaints Baby weighed 9 pounds 4 ounces.

## 2012-04-02 NOTE — Progress Notes (Signed)
PPD #2 No problems Afeb, VSS Fundus firm, NT D/c home

## 2012-04-02 NOTE — Discharge Summary (Signed)
Obstetric Discharge Summary Reason for Admission: induction of labor Prenatal Procedures: NST and ultrasound Intrapartum Procedures: spontaneous vaginal delivery Postpartum Procedures: none Complications-Operative and Postpartum: bilateral labial lacerations Hemoglobin  Date Value Range Status  04/01/2012 11.8* 12.0 - 15.0 g/dL Final     HCT  Date Value Range Status  04/01/2012 34.6* 36.0 - 46.0 % Final    Physical Exam:  General: alert Lochia: appropriate Uterine Fundus: firm  Discharge Diagnoses: Term Pregnancy-delivered and VBAC  Discharge Information: Date: 04/02/2012 Activity: pelvic rest Diet: routine Medications: Ibuprofen Condition: stable Instructions: refer to practice specific booklet Discharge to: home Follow-up Information   Follow up with Bing Plume, MD. Schedule an appointment as soon as possible for a visit in 6 weeks.   Contact information:   [address], [address] [place] [address] [phone]       Newborn Data: Live born female  Birth Weight: 9 lb 4.2 oz (4200 g) APGAR: 9, 9  Home with mother.  [facility]D D 04/02/2012, 9:51 AM

## 2012-04-04 LAB — TYPE AND SCREEN
ABO/RH(D): A POS
Antibody Screen: NEGATIVE
Unit division: 0
Unit division: 0

## 2012-04-06 ENCOUNTER — Ambulatory Visit (HOSPITAL_COMMUNITY)
Admit: 2012-04-06 | Discharge: 2012-04-06 | Disposition: A | Discharge status: Home / Self Care | Payer: Medicaid Other | Attending: Obstetrics and Gynecology | Admitting: Obstetrics and Gynecology

## 2012-04-06 NOTE — Lactation Note (Addendum)
Adult Lactation Consultation Outpatient Visit Note  Patient Name: Andrea Lang    Infant: Daryll Drown Date of Birth: 1990-11-28    DOB: [birth year] Gestational Age at Delivery: [redacted]w[redacted]d   BW: 9# 4.2 oz Type of Delivery: VBAC    D/c weight: 8# 13 oz        Weight on 04/04/12: 8# 15 oz        Today's weight: 9# 2 oz  Breastfeeding History: Frequency of Breastfeeding: once-twice/day  Length of Feeding: 10 min or less Voids: 4/day clear/yellow Stools: 5/day mustard-yellow  Supplementing / Method: Pumping:  Type of Pump: Symphony   Frequency: q2-3 hrs x 15 min  Volume: 5 oz/time  Comments: 8 bottles/day, 3 oz at a time.  Last bottle at 7am: 3 oz   Consultation Evaluation:  Initial Feeding Assessment: Pre-feed Weight: 4144g Post-feed Weight: 4200g Amount Transferred: 56 mL Comments: L breast, easy latch, no nipple shield needed, almost 30 min  Additional Feeding Assessment: Pre-feed: 4300g (baby now clothed) Post-feed: 4326g Amount transferred: 26mL Comments: R breast, no nipple shield, short feed  Total Breast milk Transferred this Visit: 82 mL   Follow-Up  Mom has been trying to put baby to breast once or twice a day, but has not been having much luck (it is uncomfortable or baby doesn't feed for long).  Mom admits that she "gives up" easily when latching doesn't go well and then turns to a bottle.  Mom does desire to feed baby at the breast, though.    Mom's breasts are very full, despite having pumped just 2 hours ago.  Mom has been mostly pumping and BO.  Mom shown how to do an asymmetric latch. Baby latched very easily (without the nipple shield) and Mom had no discomfort. Mom also admitted to concerns about baby being able to breathe.  Mom reassured & shown how there was still good nose space for the baby to breathe. Mom's nipple shape was slightly distorted when baby released latch.   Baby desired to go back to the breast an additional time after having his diaper  changed and being dressed to go.  He took almost an additional oz in a little more than 5 minutes! No nipple distortion noted when baby released this latch.   Baby has some jaundice (down through torso), but Mom reports that bili was drawn on Mon & it was WNL.  She also reports that he is less yellow today than 2 days ago.  Baby's stools are mustard yellow & seedy.  Baby has gained 3 oz in the last 2 days.  Baby is only 2 oz away from BW at 6 DOL.   Baby's next appt is March 20th w/Dr. Earlene Plater.   Lurline Hare [facility] 04/06/2012, 8:54 AM

## 2012-04-13 ENCOUNTER — Telehealth (HOSPITAL_COMMUNITY): Payer: Self-pay | Admitting: *Deleted

## 2012-04-13 NOTE — Telephone Encounter (Signed)
Resolve episode 

## 2013-11-24 ENCOUNTER — Encounter: Payer: Self-pay | Admitting: Internal Medicine

## 2013-11-27 ENCOUNTER — Encounter (HOSPITAL_COMMUNITY): Payer: Self-pay | Admitting: *Deleted

## 2014-01-18 ENCOUNTER — Encounter: Payer: Self-pay | Admitting: Internal Medicine

## 2014-01-18 ENCOUNTER — Ambulatory Visit (INDEPENDENT_AMBULATORY_CARE_PROVIDER_SITE_OTHER): Payer: BC Managed Care – PPO | Admitting: Internal Medicine

## 2014-01-18 VITALS — BP 100/70 | HR 72 | Ht 66.0 in | Wt 221.0 lb

## 2014-01-18 DIAGNOSIS — K625 Hemorrhage of anus and rectum: Secondary | ICD-10-CM

## 2014-01-18 NOTE — Patient Instructions (Signed)
It has been recommended to you by your physician that you have a(n) Colonoscopy completed. Per your request, we did not schedule the procedure(s) today. Please contact our office at [phone] should you decide to have the procedure completed.  When you call back to schedule you will be set up for a Previsit with a nurse to sign paperwork and get all instructions

## 2014-01-18 NOTE — Progress Notes (Signed)
Patient ID: Andrea Lang, female   DOB: 12-03-1990, 23 y.o.   MRN: [id number] HPI: Andrea Lang is a 23 yo female with PMH of mild asthma who is seen in consultation at the request of Eldridge Abrahams, FNP to evaluate persistent rectal bleeding. She is here alone today. She reports she's had rectal bleeding on and off for 2 years. Initially she was told that this might have been hemorrhoid and related to a low fiber diet. She reports that she has blood with every single bowel movement. Bowel movements have been regular oh occasionally she does have to strain with stool. She has approximately 2 stools per day. She does not feel constipated. She's had some mild lower abdominal pain which does not radiate. She reports a good appetite without nausea or vomiting. She sees bright red and occasionally dark red blood mixed with her stool. Occasionally is present with wiping or dripping into the toilet. She's tried laxatives such as MiraLAX which did not change her help the bleeding. She also tried hydrocortisone suppositories without benefit. She is increased fiber in her diet and this did not help and thinks it may have made it worse. She has never had colonoscopy or flexible sigmoidoscopy. She denies a family history of colorectal cancer or IBD. She is married and has 2 young sons.  Rectal exam performed by primary care revealed no masses "minimal tenderness" and was reportedly Hemoccult negative.  Past Medical History  Diagnosis Date  . Seasonal allergies   . Sinusitis   . Asthma     no real dx; but requires use of an inhaler for occassional SOB    Past Surgical History  Procedure Laterality Date  . Cesarean section    . Tonsillectomy      Outpatient Prescriptions Prior to Visit  Medication Sig Dispense Refill  . albuterol (PROVENTIL HFA;VENTOLIN HFA) 108 (90 BASE) MCG/ACT inhaler Inhale 1-2 puffs into the lungs every 6 (six) hours as needed for wheezing. 1 Inhaler 0  . calcium carbonate (TUMS - DOSED IN MG  ELEMENTAL CALCIUM) 500 MG chewable tablet Chew 1 tablet by mouth daily as needed for heartburn.    . Prenatal Vit-Fe Fumarate-FA (PRENATAL MULTIVITAMIN) TABS Take 1 tablet by mouth daily at 12 noon.     No facility-administered medications prior to visit.    Allergies  Allergen Reactions  . Avocado Swelling  . Banana Swelling  . Food Swelling    Mango,banana, & avacodo   Swelling of the lips  . Kiwi Extract Swelling  . Mangifera Indica Swelling  . Latex Rash    No family history on file.  History  Substance Use Topics  . Smoking status: Never Smoker   . Smokeless tobacco: Never Used  . Alcohol Use: No    ROS: As per history of present illness, otherwise negative  BP 100/70 mmHg  Pulse 72  Ht 5\' 6"  (1.676 m)  Wt 221 lb (100.245 kg)  BMI 35.69 kg/m2 Constitutional: Well-developed and well-nourished. No distress. HEENT: Normocephalic and atraumatic. Oropharynx is clear and moist. No oropharyngeal exudate. Conjunctivae are normal.  No scleral icterus. Neck: Neck supple. Trachea midline. Cardiovascular: Normal rate, regular rhythm and intact distal pulses. No M/R/G Pulmonary/chest: Effort normal and breath sounds normal. No wheezing, rales or rhonchi. Abdominal: Soft, nontender, nondistended. Bowel sounds active throughout. There are no masses palpable. No hepatosplenomegaly. Extremities: no clubbing, cyanosis, or edema Lymphadenopathy: No cervical adenopathy noted. Neurological: Alert and oriented to person place and time. Skin: Skin is warm  and dry. No rashes noted. Psychiatric: Normal mood and affect. Behavior is normal.  RELEVANT LABS AND IMAGING: CBC    Component Value Date/Time   WBC 15.6* 04/01/2012 0505   RBC 3.96 04/01/2012 0505   HGB 11.8* 04/01/2012 0505   HCT 34.6* 04/01/2012 0505   PLT 157 04/01/2012 0505   MCV 87.4 04/01/2012 0505   MCH 29.8 04/01/2012 0505   MCHC 34.1 04/01/2012 0505   RDW 13.4 04/01/2012 0505   LYMPHSABS 2.9 03/29/2011 0011    MONOABS 0.7 03/29/2011 0011   EOSABS 0.0 03/29/2011 0011   BASOSABS 0.0 03/29/2011 0011   Hgb 11/15/2013 -- 13.8, MCV 87.9  ASSESSMENT/PLAN:  23 yo female with PMH of mild asthma who is seen in consultation at the request of Eldridge Abrahams, FNP to evaluate persistent rectal bleeding.  1. Rectal bleeding -- her bleeding has been persistent for 2 years. She also describes some mild tenesmus symptoms and also abdominal pain. Hemorrhoids were not seen at the time of rectal exam previously though hemorrhoids, particular internal, remain in the diagnosis. Given the persistence of bleeding along with abdominal pain I have recommended colonoscopy to exclude IBD. We discussed the test including the risks and benefits and she is agreeable to proceed. If IBD is excluded and she has internal hemorrhoids, I would recommend hemorrhoidal banding given persistent leading. Further recommendations after colonoscopy

## 2014-01-29 ENCOUNTER — Encounter: Payer: Self-pay | Admitting: Internal Medicine

## 2014-03-27 ENCOUNTER — Ambulatory Visit (AMBULATORY_SURGERY_CENTER): Payer: Self-pay | Admitting: *Deleted

## 2014-03-27 VITALS — Ht 66.0 in | Wt 221.0 lb

## 2014-03-27 DIAGNOSIS — K625 Hemorrhage of anus and rectum: Secondary | ICD-10-CM

## 2014-03-27 MED ORDER — MOVIPREP 100 G PO SOLR: ORAL | Status: DC

## 2014-03-27 NOTE — Progress Notes (Signed)
No egg or soy allergy  No anesthesia or intubation problems per pt  No diet medications taken  Registered in EMMI   

## 2014-04-02 ENCOUNTER — Encounter: Payer: Self-pay | Admitting: Internal Medicine

## 2014-04-10 ENCOUNTER — Telehealth: Payer: Self-pay | Admitting: Internal Medicine

## 2014-04-10 ENCOUNTER — Ambulatory Visit (AMBULATORY_SURGERY_CENTER): Payer: BLUE CROSS/BLUE SHIELD | Admitting: Internal Medicine

## 2014-04-10 ENCOUNTER — Encounter: Payer: Self-pay | Admitting: Internal Medicine

## 2014-04-10 VITALS — BP 113/54 | HR 65 | Temp 97.5°F | Resp 14 | Ht 66.0 in | Wt 221.0 lb

## 2014-04-10 DIAGNOSIS — D122 Benign neoplasm of ascending colon: Secondary | ICD-10-CM

## 2014-04-10 DIAGNOSIS — D123 Benign neoplasm of transverse colon: Secondary | ICD-10-CM

## 2014-04-10 DIAGNOSIS — D124 Benign neoplasm of descending colon: Secondary | ICD-10-CM

## 2014-04-10 DIAGNOSIS — D125 Benign neoplasm of sigmoid colon: Secondary | ICD-10-CM

## 2014-04-10 DIAGNOSIS — K625 Hemorrhage of anus and rectum: Secondary | ICD-10-CM

## 2014-04-10 MED ORDER — SODIUM CHLORIDE 0.9 % IV SOLN: 500.0000 mL | INTRAVENOUS | Status: DC

## 2014-04-10 NOTE — Progress Notes (Signed)
Report to PACU, RN, vss, BBS= Clear.  

## 2014-04-10 NOTE — Op Note (Signed)
[facility]  [address] [place], [address]   COLONOSCOPY PROCEDURE REPORT  PATIENT: Andrea, Lang  MR#: [id number] BIRTHDATE: 15-Mar-1990 , 23  yrs. old GENDER: female ENDOSCOPIST: Jerene Bears, MD REFERRED BY: Eldridge Abrahams, FNP PROCEDURE DATE:  04/10/2014 PROCEDURE:   Colonoscopy with snare polypectomy First Screening Colonoscopy - Avg.  risk and is 50 yrs.  old or older - No.  Prior Negative Screening - Now for repeat screening. N/A  History of Adenoma - Now for follow-up colonoscopy & has been > or = to 3 yrs.  N/A ASA CLASS:   Class II INDICATIONS:rectal bleeding. MEDICATIONS: Monitored anesthesia care and Propofol 420 mg IV  DESCRIPTION OF PROCEDURE:   After the risks benefits and alternatives of the procedure were thoroughly explained, informed consent was obtained.  The digital rectal exam revealed no rectal mass.   The LB [id number]  endoscope was introduced through the anus and advanced to the cecum, which was identified by both the appendix and ileocecal valve. No adverse events experienced. The quality of the prep was (MoviPrep was used) good.  The instrument was then slowly withdrawn as the colon was fully examined.      COLON FINDINGS: Multiple polyps, sessile and pedunculated, were found throughout the entire examined colon. There were no polyps seen in the cecum, scattered polyps throughout the ascending colon, multiple polyps along the transverse colon, large pedunculated and sessile polyps in the descending and sigmoid colon. The rectum is relatively spared. Polypectomies were performed on 5 pedunculated polyps in the descending and sigmoid colon as a sample using snare cautery.  The resection of these 5 polyps only was complete, the polyp tissue was completely retrieved and sent to histology. Retroflexed views revealed no abnormalities. The time to cecum = 2 min 28 sec Withdrawal time = 27 min 26 sec   The scope was withdrawn and  the procedure completed.  COMPLICATIONS: There were no immediate complications.  ENDOSCOPIC IMPRESSION: Multiple polyps were found throughout the entire examined colon; 5 polypectomies were performed using snare cautery. 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  No Aspirin or NSAIDs 2.  Await pathology results 3.  Medical genetics referral 4.  Surgical referral for discussion of total colectomy given multiple colon polyps throughout the colon (await path results 1st)   eSigned:  Jerene Bears, MD 04/10/2014 11:03 AM   cc: the patient, Eldridge Abrahams, FNP   PATIENT NAME:  Andrea, Lang MR#: [id number]

## 2014-04-10 NOTE — Telephone Encounter (Signed)
Patient with complaints of bright red bloody stools x 2 in the last 2 hours since procedure this morning. Dr. Hilarie Fredrickson talked with patient on the phone and advised patient to watch and see if she has another bloody stool in the next 30 minutes to 1 hour to call us back. Certainly call on call doctor if continues during the night time. Advised to drink clear liquids only for the rest of the day, no solid foods. Will await to see if patient calls for further assessment today. if not, will follow up with bleeding in the morning on follow up phone call.

## 2014-04-10 NOTE — Progress Notes (Signed)
Called to room to assist during endoscopic procedure.  Patient ID and intended procedure confirmed with present staff. Received instructions for my participation in the procedure from the performing physician.  

## 2014-04-10 NOTE — Patient Instructions (Signed)
Discharge instructions given. Handout on polyps. No aspirin or NSAIDS. Genetics referral. Resume previous medications. YOU HAD AN ENDOSCOPIC PROCEDURE TODAY AT [facility]:   Refer to the procedure report that was given to you for any specific questions about what was found during the examination.  If the procedure report does not answer your questions, please call your gastroenterologist to clarify.  If you requested that your care partner not be given the details of your procedure findings, then the procedure report has been included in a sealed envelope for you to review at your convenience later.  YOU SHOULD EXPECT: Some feelings of bloating in the abdomen. Passage of more gas than usual.  Walking can help get rid of the air that was put into your GI tract during the procedure and reduce the bloating. If you had a lower endoscopy (such as a colonoscopy or flexible sigmoidoscopy) you may notice spotting of blood in your stool or on the toilet paper. If you underwent a bowel prep for your procedure, you may not have a normal bowel movement for a few days.  Please Note:  You might notice some irritation and congestion in your nose or some drainage.  This is from the oxygen used during your procedure.  There is no need for concern and it should clear up in a day or so.  SYMPTOMS TO REPORT IMMEDIATELY:   Following lower endoscopy (colonoscopy or flexible sigmoidoscopy):  Excessive amounts of blood in the stool  Significant tenderness or worsening of abdominal pains  Swelling of the abdomen that is new, acute  Fever of 100F or higher   For urgent or emergent issues, a gastroenterologist can be reached at any hour by calling [phone].   DIET: Your first meal following the procedure should be a small meal and then it is ok to progress to your normal diet. Heavy or fried foods are harder to digest and may make you feel nauseous or bloated.  Likewise, meals heavy in dairy  and vegetables can increase bloating.  Drink plenty of fluids but you should avoid alcoholic beverages for 24 hours.  ACTIVITY:  You should plan to take it easy for the rest of today and you should NOT DRIVE or use heavy machinery until tomorrow (because of the sedation medicines used during the test).    FOLLOW UP: Our staff will call the number listed on your records the next business day following your procedure to check on you and address any questions or concerns that you may have regarding the information given to you following your procedure. If we do not reach you, we will leave a message.  However, if you are feeling well and you are not experiencing any problems, there is no need to return our call.  We will assume that you have returned to your regular daily activities without incident.  If any biopsies were taken you will be contacted by phone or by letter within the next 1-3 weeks.  Please call us at [phone] if you have not heard about the biopsies in 3 weeks.    SIGNATURES/CONFIDENTIALITY: You and/or your care partner have signed paperwork which will be entered into your electronic medical record.  These signatures attest to the fact that that the information above on your After Visit Summary has been reviewed and is understood.  Full responsibility of the confidentiality of this discharge information lies with you and/or your care-partner.

## 2014-04-11 ENCOUNTER — Telehealth: Payer: Self-pay | Admitting: *Deleted

## 2014-04-11 ENCOUNTER — Other Ambulatory Visit: Payer: Self-pay

## 2014-04-11 DIAGNOSIS — Z860100 Personal history of colon polyps, unspecified: Secondary | ICD-10-CM

## 2014-04-11 NOTE — Telephone Encounter (Signed)
  Follow up Call-  Call back number 04/10/2014  Post procedure Call Back phone  # [phone]  Permission to leave phone message Yes     Patient questions:  Do you have a fever, pain , or abdominal swelling? No. patient denies any fever or pain.  Pain Score  0 *  Have you tolerated food without any problems? Drinking, not eating yet.  Have you been able to return to your normal activities? Yes.    Do you have any questions about your discharge instructions: Diet   No. Medications  No. Follow up visit  No.  Do you have questions or concerns about your Care? No.  Actions: * If pain score is 4 or above: No action needed, pain <4.   Spoke with patient, she denies any more passing blood, she states had BM half hour ago with blood in stool but no other bleeding seen since last night. Encouraged patient to call us back if she starts bleeding again. Encouraged patient to eat and drink today. She verbalizes understanding.

## 2014-04-12 ENCOUNTER — Telehealth: Payer: Self-pay | Admitting: Genetic Counselor

## 2014-04-12 NOTE — Telephone Encounter (Signed)
CALLED PATIENT TO SCHEDULE APPT. LEFT MESSAGE.  DX: HISTORY OF COLONIC POLYPS REFERRING: [facility]

## 2014-04-12 NOTE — Telephone Encounter (Signed)
CALLED PT AND SCHEDULED A GEN COUNSELING APPT FOR 05/02/14 @ 9AM.

## 2014-04-12 NOTE — Telephone Encounter (Signed)
CALLED PATIENT TO SCHEDULE GEN COUNSELING APPT. LEFT MSG.

## 2014-04-19 ENCOUNTER — Telehealth: Payer: Self-pay

## 2014-04-19 ENCOUNTER — Encounter: Payer: Self-pay | Admitting: Internal Medicine

## 2014-04-19 NOTE — Telephone Encounter (Signed)
Spoke with pt and she is aware of appt. 

## 2014-04-19 NOTE — Telephone Encounter (Signed)
Pt scheduled to see Dr. Marcello Moores with [facility] 05/18/14 @9am . Pt to arrive there at 8:30am. Left message for pt to call back.

## 2014-04-26 ENCOUNTER — Other Ambulatory Visit: Payer: BLUE CROSS/BLUE SHIELD

## 2014-04-26 ENCOUNTER — Ambulatory Visit (HOSPITAL_BASED_OUTPATIENT_CLINIC_OR_DEPARTMENT_OTHER): Payer: BLUE CROSS/BLUE SHIELD | Admitting: Genetic Counselor

## 2014-04-26 DIAGNOSIS — Z860100 Personal history of colon polyps, unspecified: Secondary | ICD-10-CM | POA: Insufficient documentation

## 2014-04-26 NOTE — Progress Notes (Signed)
[facility] Patient Visit  REFERRING PROVIDER: Berkley Harvey, NP [address], [place] [address]  PRIMARY PROVIDER:  Berkley Harvey, NP  PRIMARY REASON FOR VISIT:  1. History of colonic polyps     HISTORY OF PRESENT ILLNESS:   Andrea Lang, a 24 y.o. female, was seen for a [facility] cancer genetics consultation at the request of Dr. Ronnald Ramp due to a personal history of multiple colon polyps recently identified on a colonoscopy performed on 04/10/2014. Andrea Lang underwent colonoscopy due to rectal bleeding and at the time was found to have numerous colon polyps (possibly >100) throughout the colon. Polypectomy was performed on 5 polyps and pathology confirmed these to be adenomas. She has no history of cancer and has never had an EGD. She also has no other findings associated with familial polyposis syndrome such as CHRPE, desmoids, osteomas and/or super numerary teeth.  Andrea Lang presents to clinic today to discuss the possibility of a hereditary predisposition to cancer, genetic testing, and to further clarify her future cancer risks, as well as potential cancer risks for family members.   HORMONAL RISK FACTORS:  Menarche was at age 11.  First live birth at age 58.  OCP use for approximately 2 months years.  Ovaries intact: yes.  Hysterectomy: no.  Menopausal status: premenopausal.  Mammogram within the last year: n/a. Number of breast biopsies: 0.  Past Medical History  Diagnosis Date   Seasonal allergies    Sinusitis    Asthma     no real dx; but requires use of an inhaler for occassional SOB, more with pregnancy   Allergy     Past Surgical History  Procedure Laterality Date   Cesarean section     Tonsillectomy      History   Social History   Marital Status: Married    Spouse Name: N/A   Number of Children: 2   Years of Education: N/A   Occupational History   student    Social  History Main Topics   Smoking status: Never Smoker    Smokeless tobacco: Never Used   Alcohol Use: No   Drug Use: No   Sexual Activity: Yes    Patent examiner Protection: Condom, IUD   Other Topics Concern   Not on file   Social History Narrative     FAMILY HISTORY:  During the visit, a 4-generation pedigree was obtained. A copy of the pedigree with be scanned into Epic under the Media tab. Significant family history diagnoses include the following: Family History  Problem Relation Age of Onset   Colon cancer Neg Hx    Esophageal cancer Neg Hx    Rectal cancer Neg Hx    Stomach cancer Neg Hx    [name] ancestry is of [place] descent. There is no known Jewish ancestry or consanguinity.  GENETIC COUNSELING ASSESSMENT:  Andrea Lang is a 24 y.o. female with a personal history of colon polyps suggestive of a familial polyposis syndrome. We, therefore, discussed and recommended the following at today's visit.   DISCUSSION:  We reviewed the characteristics, features and inheritance patterns of hereditary cancer syndromes specifically discussing familial polyposis syndrome. We also discussed genetic testing, including the appropriate family members to test, the process of testing, insurance coverage and turn-around-time for results. We discussed the implications of a negative, positive and/or variant of uncertain significant result. We recommended Andrea Lang pursue genetic testing for the  APC and MYH genes. If this testing is negative, we recommended she have reflex testing to a hereditary colorectal cancer gene panel.   PLAN:  Based on our above recommendation, Andrea Lang wished to pursue genetic testing and the blood sample was drawn and will be sent to [facility] for analysis. Results for the APC and MYH genes should be available within approximately 3 weeks time, at which point they will be disclosed by telephone to Andrea Lang, as will any additional recommendations  warranted by these results. Lastly, we encouraged Andrea Lang to remain in contact with cancer genetics annually so that we can continuously update the family history and inform her of any changes in cancer genetics and testing that may be of benefit for this family.   Ms.  Arva Chafe questions were answered to her satisfaction today. Our contact information was provided should additional questions or concerns arise. Thank you for the referral and allowing [place] to share in the care of your patient.   Catherine A. Fine, MS, CGC Certified Psychologist, sport [email or web address] phone: [phone]  The patient was seen for a total of 45 minutes in face-to-face genetic counseling.  This patient was discussed with Dr. Jana Hakim who agrees with the above.    ______________________________________________________________________ For Office Staff:  Number of people involved in session including genetic counselor: 2 Was an intern or student involved with case: not applicable

## 2014-05-02 ENCOUNTER — Other Ambulatory Visit: Payer: BLUE CROSS/BLUE SHIELD

## 2014-05-02 ENCOUNTER — Encounter: Payer: BLUE CROSS/BLUE SHIELD | Admitting: Genetic Counselor

## 2014-05-18 ENCOUNTER — Other Ambulatory Visit: Payer: Self-pay | Admitting: General Surgery

## 2014-05-18 NOTE — H&P (Signed)
History of Present Illness   The patient is a 24 year old female who presents with a colonic polyp. 24 year old female who was seen by Dr. Hilarie Lang for continued rectal bleeding with bowel movements. She had minimal hemorrhoid disease on physical exam and therefore colonoscopy was performed to evaluate this further. Colonoscopy showed significant polyposis (>100) of her ascending, transverse, descending and colon. Her rectum was relatively spared. A polypectomy was performed on 5 polyps. These all showed adenomatous changes. She underwent genetics counseling and testing on March 31. She is still awaiting the results of these tests. She has no family history of colon cancer. Past surgical history is only significant for a cesarean section.       Allergies   No Known Drug Allergies 05/18/2014    Family History   First Degree Relatives  No pertinent family history    Social History   Caffeine use: Carbonated beverages, Coffee  No alcohol use  No drug use  Tobacco use: Never smoker    Medication History   Medications Reconciled.    Pregnancy/Birth   Age at menarche: 76 years  Contraceptive History: Intrauterine device  Gravida  4  Irregular periods  Maternal age  60-20  Para  2    Other Problems   Gastroesophageal Reflux Disease    Past Surgical   Cesarean Section - 1  Colon Polyp Removal - Colonoscopy  Tonsillectomy    Diagnostic Studies   Colonoscopy; within last year  Mammogram; never  Pap Smear; 1-5 years ago     Review of Systems   General Present- Weight Gain. Not Present- Appetite Loss, Chills, Fatigue, Fever, Night Sweats and Weight Loss. Skin Not Present- Change in Wart/Mole, Dryness, Hives, Jaundice, New Lesions, Non-Healing Wounds, Rash and Ulcer. HEENT Present- Seasonal Allergies. Not Present- Earache, Hearing Loss, Hoarseness, Nose Bleed, Oral Ulcers, Ringing in the Ears, Sinus Pain, Sore Throat, Visual Disturbances, Wears glasses/contact lenses and Yellow  Eyes. Respiratory Not Present- Bloody sputum, Chronic Cough, Difficulty Breathing, Snoring and Wheezing. Breast Not Present- Breast Mass, Breast Pain, Nipple Discharge and Skin Changes. Cardiovascular Not Present- Chest Pain, Difficulty Breathing Lying Down, Leg Cramps, Palpitations, Rapid Heart Rate, Shortness of Breath and Swelling of Extremities. Gastrointestinal Present- Bloody Stool. Not Present- Abdominal Pain, Bloating, Change in Bowel Habits, Chronic diarrhea, Constipation, Difficulty Swallowing, Excessive gas, Gets full quickly at meals, Hemorrhoids, Indigestion, Nausea, Rectal Pain and Vomiting. Female Genitourinary Not Present- Frequency, Nocturia, Painful Urination, Pelvic Pain and Urgency. Musculoskeletal Not Present- Back Pain, Joint Pain, Joint Stiffness, Muscle Pain, Muscle Weakness and Swelling of Extremities. Neurological Not Present- Decreased Memory, Fainting, Headaches, Numbness, Seizures, Tingling, Tremor, Trouble walking and Weakness. Endocrine Not Present- Cold Intolerance, Excessive Hunger, Hair Changes, Heat Intolerance, Hot flashes and New Diabetes. Hematology Not Present- Easy Bruising, Excessive bleeding, Gland problems, HIV and Persistent Infections.   Vitals   05/18/2014 09:03 AM   Weight: 223 lb, 4 oz Height: 67 in   Body Surface Area: 2.19 m Body Mass Index: 34.97 kg/m     Temp.: 98.1 F (Oral) Pulse: 73 (Regular)   BP: 118/78 Manual (Sitting, Left Arm, Standard)     Physical Exam  The physical exam findings are as follows:  General Mental Status - Alert.  General Appearance - Consistent with stated age.  Hydration - Well hydrated.  Voice - Normal.    Head and Neck Head - normocephalic, atraumatic with no lesions or palpable masses.  Trachea - midline.  Thyroid - Gland Characteristics - normal size and  consistency.    Eye Eyeball - Bilateral - Extraocular movements intact.  Sclera/Conjunctiva - Bilateral - No scleral icterus.    Chest and Lung  Exam Chest and lung exam reveals - quiet, even and easy respiratory effort with no use of accessory muscles and on auscultation, normal breath sounds, no adventitious sounds and normal vocal resonance.  Inspection - Chest Wall - Normal. Back - normal.    Cardiovascular Cardiovascular examination reveals - normal heart sounds, regular rate and rhythm with no murmurs and normal pedal pulses bilaterally.    Abdomen Inspection - Inspection of the abdomen reveals - No Hernias.  Palpation/Percussion - Palpation and Percussion of the abdomen reveal - Soft, Non Tender, No Rebound tenderness, No Rigidity (guarding) and No hepatosplenomegaly.  Auscultation - Auscultation of the abdomen reveals - Bowel sounds normal.    Neurologic Neurologic evaluation reveals - alert and oriented x 3 with no impairment of recent or remote memory.  Mental Status - Normal.    Musculoskeletal Global Assessment Normal Exam - Left - Upper Extremity Strength Normal and Lower Extremity Strength Normal.  Normal Exam - Right - Upper Extremity Strength Normal and Lower Extremity Strength Normal.     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 I have recommended total abd colectomy with ileorectal anastomosis. This will require yearly evaluation of the rectum with endoscopic exams. She understands that she may possibly need a proctectomy in the future with possible J-pouch. We discussed possibly doing this with her surgery if she were positive for FAP to reduce her future cancer risk. The negative risk of this would be possible problems with fertility in the future. She does not wish to have a proctectomy at this point. The surgery and anatomy were described to the patient as well as the risks of surgery and the possible complications. These include: Bleeding, deep abdominal infections and possible wound  complications such as hernia and infection, damage to adjacent structures, leak of surgical connections, which can lead to other surgeries and possibly an ostomy, possible need for other procedures, such as abscess drains in radiology, possible prolonged hospital stay, probable diarrhea from removal of part of the colon, prolonged fatigue/weakness or appetite loss, possible early recurrence of of disease, possible complications of their medical problems such as heart disease or arrhythmias or lung problems, death (less than 1%). I believe the patient understands and wishes to proceed with the surgery.   Current Plans:  Started Flagyl 500MG , 2 (two) Tablet SEE NOTE, #6, 05/18/2014, No Refill.  Started Neomycin Sulfate 500MG , 2 (two) Tablet SEE NOTE, #6, 05/18/2014, No Refill.  CCS Colon Bowel Prep 2015 Miralax/Antibiotics

## 2014-05-21 ENCOUNTER — Encounter: Payer: Self-pay | Admitting: Genetic Counselor

## 2014-05-21 DIAGNOSIS — Z86010 Personal history of colonic polyps: Secondary | ICD-10-CM

## 2014-05-21 NOTE — Progress Notes (Signed)
Ms. Andrea Lang recently had cancer genetic counseling at [facility] on 04/22/2013. At that time, it was recommended she pursue genetic testing. Her APC and MYH gene test for familial polyposis, which was performed at [facility], has returned and both genes are negative for mutations. These results were disclosed to her on 05/20/2014. Per her request, reflex testing for the ColoNext gene panel at [facility] was initiated. The ColoNext gene panel offered by [employer] includes sequencing and rearrangement analysis for the following genes: APC, BMPR1A, CDH1, CHEK2, EPCAM, GREM1, MLH1, MSH2, MSH6, MUTYH, PMS2, POLD1, POLE, PTEN, SMAD4, STK11, and TP53.Results for the gene panel should be available in 2-3 more weeks and we will contact her to discuss these results and recommendations warranted by these results once available.

## 2014-06-07 ENCOUNTER — Encounter: Payer: Self-pay | Admitting: Genetic Counselor

## 2014-06-07 DIAGNOSIS — Z86010 Personal history of colonic polyps: Secondary | ICD-10-CM

## 2014-06-07 NOTE — Progress Notes (Signed)
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 General Interpretation: High risk follow up warranted based on personal history of multiple colon polyps  HPI: Andrea Lang was previously seen in the [facility] due to concerns regarding a hereditary predisposition to cancer. Please refer to our prior cancer genetics clinic note for more information regarding Andrea Lang medical, social and family histories, and our assessment and recommendations, at the time. Andrea Lang genetic test Lang and recommendations warranted by these Lang were recently disclosed to her and are discussed in more detail below.  GENETIC TEST Lang: At the time of Andrea Lang visit, we recommended she pursue genetic testing, which includes sequencing and deletion/duplication analysis of several genes associated with an increased risk for cancer via a gene panel. The ColoNext gene panel offered by [employer] includes sequencing and rearrangement analysis for the following genes: APC, BMPR1A, CDH1, CHEK2, EPCAM, GREM1, MLH1, MSH2, MSH6, MUTYH, PMS2, POLD1, POLE, PTEN, SMAD4, STK11, and TP53. Genetic testing for this gene panel was normal and did not reveal a pathogenic mutation in any of these genes. A copy of the genetic test report will be scanned into Epic under the media tab.  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 At this time, it is unknown if this variant is associated with an increased risk for cancer or if this is a normal finding. With time, we suspect the lab will reclassify this variant  and when they do, we will try to re-contact Andrea Lang to discuss the reclassification further.    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 Thus, we must interpret these negative Lang with some caution. This result may simply reflect our current inability to detect all mutations within these genes or there may be a different gene that has not yet been discovered or tested. This negative genetic test simply tells [place] that we cannot yet define why Andrea Lang has had an increased number of colorectal polyps. Andrea Lang medical management and screening should be based on the prospect that she will likely form more colon polyps and, therefore, may continue to be at an increased risk for colorectal cancer in the future. We, therefore, recommend she follow the NCCN Guidelines for individuals with FAP which can be coordinated by her primary and/or GI provider and are outlined below.   MEDICAL MANAGEMENT: The following are guidelines from the [employer]  (NCCN ([phone]) for individuals with FAP:  1) Annual sigmoidoscopy or colonoscopy starting at age 58 to 34 years; annual colonoscopy once polyps are detected.   2) Colectomy if needed, as recommended by a surgeon. Colectomy is typically done once the number of polyps becomes too high to follow with confidence by repeat colonoscopy.  3) Upper endoscopy starting at 20-25 years; at intervals determined by the GI doctor.  4) Examine stomach at time of upper endoscopy.  5) Annual thyroid exam starting in late teens. Annual thyroid ultrasound may be considered.  6) Due to increased risk of desmoid tumors: Annual abdominal palpation. If family history of symptomatic desmoids, consider abdominal MRI  or CT 1-3 y post-colectomy and then every 5-10 y. Suggestive abdominal symptoms should prompt immediate abdominal imaging.  6) Screening  for hepatoblastoma by liver palpation, abdominal ultrasound, and measurement of AFP from infancy until age 62 years; every 3-6 months.   RECOMMENDATIONS FOR FAMILY MEMBERS:  Without knowing the exact genetic cause responsible for Andrea Lang diagnosis, we cannot offer genetic testing to other family members.  Anyone with a family history of colon cancer and/or colon polyps is at a slight increased risk over that of the general population.  We, therefore, recommend that Andrea Lang parents, maternal and paternal relatives have a colonoscopy (by age 7-12 years), if they have not already done so. If polyps are identified, these relatives should follow the above FAP recommendations. In addition, given their young ages, we recommend her immediate family members, such as her children and siblings, follow the above FAP recommendations now, as their risk may be as high as 50% to have inherited a familial polyposis syndrome.  FOLLOW-UP: Lastly, we discussed with Andrea Lang that cancer genetics is a rapidly advancing field and it is likely that new genetic tests will be appropriate for her and/or family members in the future. We encouraged her to remain in contact with cancer genetics on an annual basis so we can update her personal and family histories and let her know of advances in cancer genetics that may benefit this family.   Our contact number was provided. Andrea Lang questions were answered to her satisfaction, and she knows she is welcome to call us at anytime with additional questions or concerns.    Andrea A. Fine, MS, CGC Certified Psychologist, [email or web address] Phone: [phone]

## 2014-06-13 NOTE — Patient Instructions (Addendum)
YOUR PROCEDURE IS SCHEDULED ON :  06/20/14  REPORT TO [facility] MAIN ENTRANCE FOLLOW SIGNS TO SHORT STAY CENTER AT :  5:30 am  CALL THIS NUMBER IF YOU HAVE PROBLEMS THE MORNING OF SURGERY [phone]  REMEMBER:  DO NOT EAT FOOD OR DRINK LIQUIDS AFTER MIDNIGHT  TAKE THESE MEDICINES THE MORNING OF SURGERY: MAY TAKE INHALERS IF NEEDED  FOLLOW BOWEL PREP  YOU MAY NOT HAVE ANY METAL ON YOUR BODY INCLUDING HAIR PINS AND PIERCING'S. DO NOT WEAR JEWELRY, MAKEUP, LOTIONS, POWDERS OR PERFUMES. DO NOT WEAR NAIL POLISH. DO NOT SHAVE 48 HRS PRIOR TO SURGERY. MEN MAY SHAVE FACE AND NECK.  DO NOT [place]. [facility] IS NOT RESPONSIBLE FOR VALUABLES.  CONTACTS, DENTURES OR PARTIALS MAY NOT BE WORN TO SURGERY. LEAVE SUITCASE IN CAR. CAN BE BROUGHT TO ROOM AFTER SURGERY.  PATIENTS DISCHARGED THE DAY OF SURGERY WILL NOT BE ALLOWED TO DRIVE HOME.  PLEASE READ OVER THE FOLLOWING INSTRUCTION SHEETS _________________________________________________________________________________                                          [facility] - PREPARING FOR SURGERY  Before surgery, you can play an important role.  Because skin is not sterile, your skin needs to be as free of germs as possible.  You can reduce the number of germs on your skin by washing with CHG (chlorahexidine gluconate) soap before surgery.  CHG is an antiseptic cleaner which kills germs and bonds with the skin to continue killing germs even after washing. Please DO NOT use if you have an allergy to CHG or antibacterial soaps.  If your skin becomes reddened/irritated stop using the CHG and inform your nurse when you arrive at Short Stay. Do not shave (including legs and underarms) for at least 48 hours prior to the first CHG shower.  You may shave your face. Please follow these instructions carefully:   1.  Shower with CHG Soap the night before surgery and the  morning of Surgery.   2.  If you choose  to wash your hair, wash your hair first as usual with your  normal  Shampoo.   3.  After you shampoo, rinse your hair and body thoroughly to remove the  shampoo.                                         4.  Use CHG as you would any other liquid soap.  You can apply chg directly  to the skin and wash . Gently wash with scrungie or clean wascloth    5.  Apply the CHG Soap to your body ONLY FROM THE NECK DOWN.   Do not use on open                           Wound or open sores. Avoid contact with eyes, ears mouth and genitals (private parts).                        Genitals (private parts) with your normal soap.              6.  Wash thoroughly, paying special attention to the area where  your surgery  will be performed.   7.  Thoroughly rinse your body with warm water from the neck down.   8.  DO NOT shower/wash with your normal soap after using and rinsing off  the CHG Soap .                9.  Pat yourself dry with a clean towel.             10.  Wear clean night clothes to bed after shower             11.  Place clean sheets on your bed the night of your first shower and do not  sleep with pets.  Day of Surgery : Do not apply any lotions/deodorants the morning of surgery.  Please wear clean clothes to the hospital/surgery center.  FAILURE TO FOLLOW THESE INSTRUCTIONS MAY RESULT IN THE CANCELLATION OF YOUR SURGERY    PATIENT SIGNATURE_________________________________  ______________________________________________________________________

## 2014-06-14 ENCOUNTER — Encounter (HOSPITAL_COMMUNITY): Payer: Self-pay

## 2014-06-14 ENCOUNTER — Encounter (HOSPITAL_COMMUNITY)
Admission: RE | Admit: 2014-06-14 | Discharge: 2014-06-14 | Disposition: A | Discharge status: Home / Self Care | Payer: BLUE CROSS/BLUE SHIELD | Source: Ambulatory Visit | Attending: General Surgery | Admitting: General Surgery

## 2014-06-14 DIAGNOSIS — Z01812 Encounter for preprocedural laboratory examination: Secondary | ICD-10-CM | POA: Insufficient documentation

## 2014-06-14 HISTORY — DX: Gastro-esophageal reflux disease without esophagitis: K21.9

## 2014-06-14 LAB — CBC
HCT: 40.4 % (ref 36.0–46.0)
Hemoglobin: 13.3 g/dL (ref 12.0–15.0)
MCH: 28.5 pg (ref 26.0–34.0)
MCHC: 32.9 g/dL (ref 30.0–36.0)
MCV: 86.7 fL (ref 78.0–100.0)
Platelets: 226 10*3/uL (ref 150–400)
RBC: 4.66 MIL/uL (ref 3.87–5.11)
RDW: 12.6 % (ref 11.5–15.5)
WBC: 8.1 10*3/uL (ref 4.0–10.5)

## 2014-06-14 LAB — BASIC METABOLIC PANEL
Anion gap: 6 (ref 5–15)
BUN: 12 mg/dL (ref 6–20)
CO2: 28 mmol/L (ref 22–32)
Calcium: 9.1 mg/dL (ref 8.9–10.3)
Chloride: 104 mmol/L (ref 101–111)
Creatinine, Ser: 0.47 mg/dL (ref 0.44–1.00)
GFR calc Af Amer: >60 mL/min (ref >60)
GFR calc non Af Amer: >60 mL/min (ref >60)
Glucose, Bld: 94 mg/dL (ref 65–99)
Potassium: 3.9 mmol/L (ref 3.5–5.1)
Sodium: 138 mmol/L (ref 135–145)

## 2014-06-14 LAB — HCG, SERUM, QUALITATIVE: Preg, Serum: NEGATIVE

## 2014-06-14 LAB — ABO/RH: ABO/RH(D): A POS

## 2014-06-15 LAB — HEMOGLOBIN A1C
Hgb A1c MFr Bld: 5.4 % (ref 4.8–5.6)
Mean Plasma Glucose: 108 mg/dL

## 2014-06-19 NOTE — Progress Notes (Signed)
Pt notified of surg time change to 7:00 am - instructed to arrive at short stay at 5:00 am

## 2014-06-20 ENCOUNTER — Encounter (HOSPITAL_COMMUNITY): Payer: Self-pay

## 2014-06-20 ENCOUNTER — Inpatient Hospital Stay (HOSPITAL_COMMUNITY): Payer: BLUE CROSS/BLUE SHIELD | Admitting: Anesthesiology

## 2014-06-20 ENCOUNTER — Inpatient Hospital Stay (HOSPITAL_COMMUNITY)
Admission: RE | Admit: 2014-06-20 | Discharge: 2014-06-23 | DRG: 330 | Disposition: A | Discharge status: Home / Self Care | Payer: BLUE CROSS/BLUE SHIELD | Source: Ambulatory Visit | Attending: General Surgery | Admitting: General Surgery

## 2014-06-20 ENCOUNTER — Encounter (HOSPITAL_COMMUNITY)
Admission: RE | Disposition: A | Discharge status: Home / Self Care | Payer: Self-pay | Source: Ambulatory Visit | Attending: General Surgery

## 2014-06-20 DIAGNOSIS — Z6834 Body mass index (BMI) 34.0-34.9, adult: Secondary | ICD-10-CM

## 2014-06-20 DIAGNOSIS — K635 Polyp of colon: Principal | ICD-10-CM | POA: Diagnosis present

## 2014-06-20 DIAGNOSIS — K625 Hemorrhage of anus and rectum: Secondary | ICD-10-CM | POA: Diagnosis present

## 2014-06-20 DIAGNOSIS — D126 Benign neoplasm of colon, unspecified: Secondary | ICD-10-CM | POA: Diagnosis present

## 2014-06-20 DIAGNOSIS — Z01812 Encounter for preprocedural laboratory examination: Secondary | ICD-10-CM

## 2014-06-20 DIAGNOSIS — K219 Gastro-esophageal reflux disease without esophagitis: Secondary | ICD-10-CM | POA: Diagnosis present

## 2014-06-20 HISTORY — PX: LAPAROSCOPIC PARTIAL COLECTOMY: SHX5907

## 2014-06-20 LAB — TYPE AND SCREEN
ABO/RH(D): A POS
Antibody Screen: NEGATIVE

## 2014-06-20 SURGERY — LAPAROSCOPIC PARTIAL COLECTOMY
Anesthesia: General

## 2014-06-20 MED ORDER — SODIUM CHLORIDE 0.9 % IJ SOLN: 9.0000 mL | INTRAMUSCULAR | Status: DC | PRN

## 2014-06-20 MED ORDER — ONDANSETRON HCL 4 MG/2ML IJ SOLN: 4.0000 mg | Freq: Four times a day (QID) | INTRAMUSCULAR | Status: DC | PRN

## 2014-06-20 MED ORDER — ONDANSETRON HCL 4 MG PO TABS: 4.0000 mg | ORAL_TABLET | Freq: Four times a day (QID) | ORAL | Status: DC | PRN

## 2014-06-20 MED ORDER — KETOROLAC TROMETHAMINE 15 MG/ML IJ SOLN: 15.0000 mg | Freq: Four times a day (QID) | INTRAMUSCULAR | Status: DC | PRN

## 2014-06-20 MED ORDER — ALBUTEROL SULFATE (2.5 MG/3ML) 0.083% IN NEBU: 3.0000 mL | INHALATION_SOLUTION | RESPIRATORY_TRACT | Status: DC | PRN

## 2014-06-20 MED ORDER — PROMETHAZINE HCL 25 MG/ML IJ SOLN: 6.2500 mg | INTRAMUSCULAR | Status: DC | PRN

## 2014-06-20 MED ORDER — MORPHINE SULFATE (PF) 1 MG/ML IV SOLN
INTRAVENOUS | Status: DC
  Administered 2014-06-20: 23:00:00 via INTRAVENOUS
  Administered 2014-06-21: 6 mg via INTRAVENOUS
  Administered 2014-06-21 (×2): via INTRAVENOUS
  Administered 2014-06-21: 4 mg via INTRAVENOUS
  Administered 2014-06-21: 7 mg via INTRAVENOUS
  Administered 2014-06-21: 6.59 mg via INTRAVENOUS
  Administered 2014-06-21: 9 mg via INTRAVENOUS
  Administered 2014-06-22: 6.99 mg via INTRAVENOUS
  Administered 2014-06-22: 3 mg via INTRAVENOUS
  Filled 2014-06-20 (×3): qty 25

## 2014-06-20 MED ORDER — DEXAMETHASONE SODIUM PHOSPHATE 10 MG/ML IJ SOLN
INTRAMUSCULAR | Status: DC | PRN
  Administered 2014-06-20: 10 mg via INTRAVENOUS

## 2014-06-20 MED ORDER — DIPHENHYDRAMINE HCL 50 MG/ML IJ SOLN: 12.5000 mg | Freq: Four times a day (QID) | INTRAMUSCULAR | Status: DC | PRN

## 2014-06-20 MED ORDER — MORPHINE SULFATE 10 MG/ML IJ SOLN
2.0000 mg | INTRAMUSCULAR | Status: DC | PRN
  Administered 2014-06-20: 2 mg via INTRAVENOUS
  Administered 2014-06-20: 4 mg via INTRAVENOUS
  Filled 2014-06-20 (×2): qty 1

## 2014-06-20 MED ORDER — HYDROMORPHONE HCL 1 MG/ML IJ SOLN
INTRAMUSCULAR | Status: DC | PRN
  Administered 2014-06-20: .4 mg via INTRAVENOUS
  Administered 2014-06-20: 0.5 mg via INTRAVENOUS
  Administered 2014-06-20 (×4): .4 mg via INTRAVENOUS
  Administered 2014-06-20: 1 mg via INTRAVENOUS

## 2014-06-20 MED ORDER — CEFOTETAN DISODIUM-DEXTROSE 2-2.08 GM-% IV SOLR
INTRAVENOUS | Status: AC
  Filled 2014-06-20: qty 50

## 2014-06-20 MED ORDER — DEXTROSE 5 % IV SOLN
2.0000 g | Freq: Two times a day (BID) | INTRAVENOUS | Status: AC
  Administered 2014-06-20: 2 g via INTRAVENOUS
  Filled 2014-06-20: qty 2

## 2014-06-20 MED ORDER — DIPHENHYDRAMINE HCL 12.5 MG/5ML PO ELIX
12.5000 mg | ORAL_SOLUTION | Freq: Four times a day (QID) | ORAL | Status: DC | PRN
Start: 2014-06-20 — End: 2014-06-23

## 2014-06-20 MED ORDER — HYDROMORPHONE HCL 1 MG/ML IJ SOLN
INTRAMUSCULAR | Status: AC
  Filled 2014-06-20: qty 1

## 2014-06-20 MED ORDER — MIDAZOLAM HCL 2 MG/2ML IJ SOLN
INTRAMUSCULAR | Status: AC
  Filled 2014-06-20: qty 2

## 2014-06-20 MED ORDER — SUFENTANIL CITRATE 50 MCG/ML IV SOLN
INTRAVENOUS | Status: AC
  Filled 2014-06-20: qty 1

## 2014-06-20 MED ORDER — LIDOCAINE HCL (CARDIAC) 20 MG/ML IV SOLN
INTRAVENOUS | Status: AC
  Filled 2014-06-20: qty 5

## 2014-06-20 MED ORDER — MONTELUKAST SODIUM 10 MG PO TABS
10.0000 mg | ORAL_TABLET | Freq: Every day | ORAL | Status: DC | PRN
  Filled 2014-06-20: qty 1

## 2014-06-20 MED ORDER — SODIUM CHLORIDE 0.9 % IJ SOLN
INTRAMUSCULAR | Status: AC
  Filled 2014-06-20: qty 10

## 2014-06-20 MED ORDER — HYDROMORPHONE HCL 2 MG/ML IJ SOLN
INTRAMUSCULAR | Status: AC
  Filled 2014-06-20: qty 1

## 2014-06-20 MED ORDER — NALOXONE HCL 0.4 MG/ML IJ SOLN: 0.4000 mg | INTRAMUSCULAR | Status: DC | PRN

## 2014-06-20 MED ORDER — ALUM & MAG HYDROXIDE-SIMETH 200-200-20 MG/5ML PO SUSP: 30.0000 mL | Freq: Four times a day (QID) | ORAL | Status: DC | PRN

## 2014-06-20 MED ORDER — LACTATED RINGERS IV SOLN: INTRAVENOUS | Status: DC

## 2014-06-20 MED ORDER — KCL IN DEXTROSE-NACL 20-5-0.45 MEQ/L-%-% IV SOLN
INTRAVENOUS | Status: DC
  Administered 2014-06-20 – 2014-06-21 (×3): via INTRAVENOUS
  Administered 2014-06-22: 50 mL/h via INTRAVENOUS
  Filled 2014-06-20 (×4): qty 1000

## 2014-06-20 MED ORDER — DEXAMETHASONE SODIUM PHOSPHATE 10 MG/ML IJ SOLN
INTRAMUSCULAR | Status: AC
  Filled 2014-06-20: qty 1

## 2014-06-20 MED ORDER — MORPHINE SULFATE 4 MG/ML IJ SOLN
4.0000 mg | Freq: Once | INTRAMUSCULAR | Status: AC
  Administered 2014-06-20: 4 mg via INTRAVENOUS
  Filled 2014-06-20: qty 1

## 2014-06-20 MED ORDER — SUFENTANIL CITRATE 50 MCG/ML IV SOLN
INTRAVENOUS | Status: DC | PRN
  Administered 2014-06-20 (×2): 10 ug via INTRAVENOUS
  Administered 2014-06-20: 20 ug via INTRAVENOUS
  Administered 2014-06-20: 10 ug via INTRAVENOUS

## 2014-06-20 MED ORDER — CLONAZEPAM 0.5 MG PO TABS: 0.5000 mg | ORAL_TABLET | Freq: Two times a day (BID) | ORAL | Status: DC | PRN

## 2014-06-20 MED ORDER — ENOXAPARIN SODIUM 40 MG/0.4ML ~~LOC~~ SOLN
40.0000 mg | SUBCUTANEOUS | Status: DC
  Administered 2014-06-21 – 2014-06-23 (×3): 40 mg via SUBCUTANEOUS
  Filled 2014-06-20 (×5): qty 0.4

## 2014-06-20 MED ORDER — MIDAZOLAM HCL 5 MG/5ML IJ SOLN
INTRAMUSCULAR | Status: DC | PRN
  Administered 2014-06-20: 2 mg via INTRAVENOUS

## 2014-06-20 MED ORDER — LACTATED RINGERS IR SOLN
Status: DC | PRN
  Administered 2014-06-20: 1

## 2014-06-20 MED ORDER — KETOROLAC TROMETHAMINE 15 MG/ML IJ SOLN
15.0000 mg | Freq: Four times a day (QID) | INTRAMUSCULAR | Status: AC
  Administered 2014-06-20 – 2014-06-22 (×8): 15 mg via INTRAVENOUS
  Filled 2014-06-20 (×7): qty 1

## 2014-06-20 MED ORDER — GLYCOPYRROLATE 0.2 MG/ML IJ SOLN
INTRAMUSCULAR | Status: AC
Start: 2014-06-20 — End: 2014-06-20
  Filled 2014-06-20: qty 3

## 2014-06-20 MED ORDER — BUPIVACAINE-EPINEPHRINE (PF) 0.25% -1:200000 IJ SOLN
INTRAMUSCULAR | Status: AC
  Filled 2014-06-20: qty 30

## 2014-06-20 MED ORDER — OXYCODONE-ACETAMINOPHEN 5-325 MG PO TABS
1.0000 | ORAL_TABLET | ORAL | Status: DC | PRN
  Administered 2014-06-22 – 2014-06-23 (×5): 2 via ORAL
  Filled 2014-06-20 (×5): qty 2

## 2014-06-20 MED ORDER — EPHEDRINE SULFATE 50 MG/ML IJ SOLN
INTRAMUSCULAR | Status: AC
  Filled 2014-06-20: qty 1

## 2014-06-20 MED ORDER — ONDANSETRON HCL 4 MG/2ML IJ SOLN
INTRAMUSCULAR | Status: DC | PRN
  Administered 2014-06-20: 4 mg via INTRAVENOUS

## 2014-06-20 MED ORDER — PROPOFOL 10 MG/ML IV BOLUS
INTRAVENOUS | Status: DC | PRN
  Administered 2014-06-20: 170 mg via INTRAVENOUS

## 2014-06-20 MED ORDER — LACTATED RINGERS IV SOLN
INTRAVENOUS | Status: DC | PRN
  Administered 2014-06-20 (×3): via INTRAVENOUS

## 2014-06-20 MED ORDER — NEOSTIGMINE METHYLSULFATE 10 MG/10ML IV SOLN
INTRAVENOUS | Status: AC
  Filled 2014-06-20: qty 1

## 2014-06-20 MED ORDER — SUCCINYLCHOLINE CHLORIDE 20 MG/ML IJ SOLN
INTRAMUSCULAR | Status: DC | PRN
  Administered 2014-06-20: 100 mg via INTRAVENOUS

## 2014-06-20 MED ORDER — NEOSTIGMINE METHYLSULFATE 10 MG/10ML IV SOLN
INTRAVENOUS | Status: DC | PRN
  Administered 2014-06-20: 4 mg via INTRAVENOUS

## 2014-06-20 MED ORDER — HYDROMORPHONE HCL 1 MG/ML IJ SOLN
0.2500 mg | INTRAMUSCULAR | Status: DC | PRN
  Administered 2014-06-20: 0.5 mg via INTRAVENOUS

## 2014-06-20 MED ORDER — ALVIMOPAN 12 MG PO CAPS
12.0000 mg | ORAL_CAPSULE | Freq: Once | ORAL | Status: AC
  Administered 2014-06-20: 12 mg via ORAL
  Filled 2014-06-20: qty 1

## 2014-06-20 MED ORDER — MEPERIDINE HCL 50 MG/ML IJ SOLN
INTRAMUSCULAR | Status: AC
  Filled 2014-06-20: qty 1

## 2014-06-20 MED ORDER — ALVIMOPAN 12 MG PO CAPS
12.0000 mg | ORAL_CAPSULE | Freq: Two times a day (BID) | ORAL | Status: DC
  Administered 2014-06-21 (×2): 12 mg via ORAL
  Filled 2014-06-20 (×4): qty 1

## 2014-06-20 MED ORDER — ONDANSETRON HCL 4 MG/2ML IJ SOLN
4.0000 mg | Freq: Four times a day (QID) | INTRAMUSCULAR | Status: DC | PRN
Start: 2014-06-20 — End: 2014-06-23
  Administered 2014-06-20: 4 mg via INTRAVENOUS
  Filled 2014-06-20: qty 2

## 2014-06-20 MED ORDER — 0.9 % SODIUM CHLORIDE (POUR BTL) OPTIME
TOPICAL | Status: DC | PRN
  Administered 2014-06-20: 2000 mL

## 2014-06-20 MED ORDER — BUPIVACAINE-EPINEPHRINE 0.25% -1:200000 IJ SOLN
INTRAMUSCULAR | Status: DC | PRN
  Administered 2014-06-20: 20 mL

## 2014-06-20 MED ORDER — DIPHENHYDRAMINE HCL 12.5 MG/5ML PO ELIX: 12.5000 mg | ORAL_SOLUTION | Freq: Four times a day (QID) | ORAL | Status: DC | PRN

## 2014-06-20 MED ORDER — PROPOFOL 10 MG/ML IV BOLUS
INTRAVENOUS | Status: AC
  Filled 2014-06-20: qty 20

## 2014-06-20 MED ORDER — KETOROLAC TROMETHAMINE 15 MG/ML IJ SOLN
INTRAMUSCULAR | Status: AC
  Filled 2014-06-20: qty 1

## 2014-06-20 MED ORDER — CEFOTETAN DISODIUM 2 G IJ SOLR
2.0000 g | INTRAMUSCULAR | Status: AC
  Administered 2014-06-20: 2 g via INTRAVENOUS

## 2014-06-20 MED ORDER — CISATRACURIUM BESYLATE 20 MG/10ML IV SOLN
INTRAVENOUS | Status: AC
  Filled 2014-06-20: qty 10

## 2014-06-20 MED ORDER — LIDOCAINE HCL (CARDIAC) 20 MG/ML IV SOLN
INTRAVENOUS | Status: DC | PRN
  Administered 2014-06-20: 100 mg via INTRAVENOUS

## 2014-06-20 MED ORDER — MIDAZOLAM HCL 2 MG/2ML IJ SOLN: 0.5000 mg | Freq: Once | INTRAMUSCULAR | Status: DC | PRN

## 2014-06-20 MED ORDER — ONDANSETRON HCL 4 MG/2ML IJ SOLN
INTRAMUSCULAR | Status: AC
  Filled 2014-06-20: qty 2

## 2014-06-20 MED ORDER — CISATRACURIUM BESYLATE 20 MG/10ML IV SOLN
INTRAVENOUS | Status: AC
Start: 2014-06-20 — End: 2014-06-20
  Filled 2014-06-20: qty 10

## 2014-06-20 MED ORDER — ACETAMINOPHEN 500 MG PO TABS
1000.0000 mg | ORAL_TABLET | Freq: Four times a day (QID) | ORAL | Status: AC
  Administered 2014-06-21: 1000 mg via ORAL
  Filled 2014-06-20 (×2): qty 2

## 2014-06-20 MED ORDER — ACETAMINOPHEN 10 MG/ML IV SOLN
1000.0000 mg | Freq: Once | INTRAVENOUS | Status: AC
  Administered 2014-06-20: 1000 mg via INTRAVENOUS
  Filled 2014-06-20: qty 100

## 2014-06-20 MED ORDER — GLYCOPYRROLATE 0.2 MG/ML IJ SOLN
INTRAMUSCULAR | Status: DC | PRN
  Administered 2014-06-20: .6 mg via INTRAVENOUS

## 2014-06-20 MED ORDER — MEPERIDINE HCL 50 MG/ML IJ SOLN: 6.2500 mg | INTRAMUSCULAR | Status: DC | PRN

## 2014-06-20 MED ORDER — CISATRACURIUM BESYLATE (PF) 10 MG/5ML IV SOLN
INTRAVENOUS | Status: DC | PRN
  Administered 2014-06-20 (×3): 4 mg via INTRAVENOUS
  Administered 2014-06-20: 2 mg via INTRAVENOUS
  Administered 2014-06-20: 4 mg via INTRAVENOUS
  Administered 2014-06-20: 12 mg via INTRAVENOUS

## 2014-06-20 SURGICAL SUPPLY — 76 items
APPLIER CLIP 5 13 M/L LIGAMAX5 (MISCELLANEOUS)
BLADE EXTENDED COATED 6.5IN (ELECTRODE) ×2 IMPLANT
CABLE HIGH FREQUENCY MONO STRZ (ELECTRODE) IMPLANT
CELLS DAT CNTRL 66122 CELL SVR (MISCELLANEOUS) IMPLANT
CHLORAPREP W/TINT 26ML (MISCELLANEOUS) ×2 IMPLANT
CLIP APPLIE 5 13 M/L LIGAMAX5 (MISCELLANEOUS) IMPLANT
DECANTER SPIKE VIAL GLASS SM (MISCELLANEOUS) ×2 IMPLANT
DRAIN CHANNEL 19F RND (DRAIN) IMPLANT
DRAPE LAPAROSCOPIC ABDOMINAL (DRAPES) ×2 IMPLANT
DRAPE UTILITY XL STRL (DRAPES) ×4 IMPLANT
DRSG OPSITE POSTOP 4X10 (GAUZE/BANDAGES/DRESSINGS) IMPLANT
DRSG OPSITE POSTOP 4X6 (GAUZE/BANDAGES/DRESSINGS) IMPLANT
DRSG OPSITE POSTOP 4X8 (GAUZE/BANDAGES/DRESSINGS) IMPLANT
ELECT PENCIL ROCKER SW 15FT (MISCELLANEOUS) ×4 IMPLANT
ELECT REM PT RETURN 9FT ADLT (ELECTROSURGICAL) ×2
ELECTRODE REM PT RTRN 9FT ADLT (ELECTROSURGICAL) ×1 IMPLANT
EVACUATOR SILICONE 100CC (DRAIN) IMPLANT
GAUZE SPONGE 4X4 12PLY STRL (GAUZE/BANDAGES/DRESSINGS) IMPLANT
GLOVE BIO SURGEON STRL SZ 6.5 (GLOVE) ×4 IMPLANT
GLOVE BIOGEL PI IND STRL 7.0 (GLOVE) ×2 IMPLANT
GLOVE BIOGEL PI INDICATOR 7.0 (GLOVE) ×2
GOWN SPEC L3 XXLG W/TWL (GOWN DISPOSABLE) ×4 IMPLANT
GOWN STRL REUS W/TWL XL LVL3 (GOWN DISPOSABLE) ×8 IMPLANT
LEGGING LITHOTOMY PAIR STRL (DRAPES) IMPLANT
LIGASURE IMPACT 36 18CM CVD LR (INSTRUMENTS) IMPLANT
LIQUID BAND (GAUZE/BANDAGES/DRESSINGS) ×2 IMPLANT
PACK COLON (CUSTOM PROCEDURE TRAY) ×2 IMPLANT
PACK GENERAL/GYN (CUSTOM PROCEDURE TRAY) ×2 IMPLANT
PORT LAP GEL ALEXIS MED 5-9CM (MISCELLANEOUS) ×2 IMPLANT
RELOAD PROXIMATE 75MM BLUE (ENDOMECHANICALS) ×8 IMPLANT
RTRCTR WOUND ALEXIS 18CM MED (MISCELLANEOUS)
SCISSORS LAP 5X35 DISP (ENDOMECHANICALS) ×2 IMPLANT
SEALER TISSUE G2 CVD JAW 35 (ENDOMECHANICALS) ×1 IMPLANT
SEALER TISSUE G2 CVD JAW 45CM (ENDOMECHANICALS) ×1
SEALER TISSUE G2 STRG ARTC 35C (ENDOMECHANICALS) IMPLANT
SET IRRIG TUBING LAPAROSCOPIC (IRRIGATION / IRRIGATOR) ×2 IMPLANT
SLEEVE XCEL OPT CAN 5 100 (ENDOMECHANICALS) ×2 IMPLANT
SOLUTION ANTI FOG 6CC (MISCELLANEOUS) ×2 IMPLANT
SPONGE LAP 18X18 X RAY DECT (DISPOSABLE) IMPLANT
STAPLER CIRC CVD 29MM 37CM (STAPLE) ×2 IMPLANT
STAPLER PROXIMATE 75MM BLUE (STAPLE) ×2 IMPLANT
STAPLER VISISTAT 35W (STAPLE) ×2 IMPLANT
SUCTION POOLE TIP (SUCTIONS) ×2 IMPLANT
SUT ETHILON 2 0 PS N (SUTURE) IMPLANT
SUT NOVA 1 T20/GS 25DT (SUTURE) IMPLANT
SUT NOVA NAB DX-16 0-1 5-0 T12 (SUTURE) IMPLANT
SUT PDS AB 0 CTX 36 PDP370T (SUTURE) ×4 IMPLANT
SUT PDS AB 1 CTX 36 (SUTURE) IMPLANT
SUT PDS AB 1 TP1 96 (SUTURE) IMPLANT
SUT PROLENE 2 0 KS (SUTURE) IMPLANT
SUT PROLENE 2 0 SH DA (SUTURE) ×4 IMPLANT
SUT SILK 2 0 (SUTURE) ×1
SUT SILK 2 0 SH CR/8 (SUTURE) ×2 IMPLANT
SUT SILK 2-0 18XBRD TIE 12 (SUTURE) ×1 IMPLANT
SUT SILK 3 0 (SUTURE) ×1
SUT SILK 3 0 SH CR/8 (SUTURE) ×2 IMPLANT
SUT SILK 3-0 18XBRD TIE 12 (SUTURE) ×1 IMPLANT
SUT VIC AB 2-0 SH 18 (SUTURE) ×2 IMPLANT
SUT VIC AB 2-0 SH 27 (SUTURE) ×1
SUT VIC AB 2-0 SH 27X BRD (SUTURE) ×1 IMPLANT
SUT VIC AB 4-0 PS2 18 (SUTURE) IMPLANT
SUT VIC AB 4-0 PS2 27 (SUTURE) ×2 IMPLANT
SUT VICRYL 2 0 18  UND BR (SUTURE)
SUT VICRYL 2 0 18 UND BR (SUTURE) IMPLANT
SYS LAPSCP GELPORT 120MM (MISCELLANEOUS)
SYSTEM LAPSCP GELPORT 120MM (MISCELLANEOUS) IMPLANT
TOWEL OR 17X26 10 PK STRL BLUE (TOWEL DISPOSABLE) ×4 IMPLANT
TOWEL OR NON WOVEN STRL DISP B (DISPOSABLE) ×4 IMPLANT
TRAY FOLEY W/METER SILVER 14FR (SET/KITS/TRAYS/PACK) ×2 IMPLANT
TROCAR BLADELESS OPT 5 100 (ENDOMECHANICALS) ×2 IMPLANT
TROCAR BLADELESS OPT 5 150 (ENDOMECHANICALS) ×4 IMPLANT
TROCAR XCEL 12X100 BLDLESS (ENDOMECHANICALS) IMPLANT
TROCAR XCEL BLUNT TIP 100MML (ENDOMECHANICALS) ×2 IMPLANT
TROCAR XCEL NON-BLD 11X100MML (ENDOMECHANICALS) IMPLANT
TUBING FILTER THERMOFLATOR (ELECTROSURGICAL) ×2 IMPLANT
TUBING INSUFFLATION 10FT LAP (TUBING) ×2 IMPLANT

## 2014-06-20 NOTE — Anesthesia Postprocedure Evaluation (Signed)
  Anesthesia Post-op Note  Patient: Andrea Lang  Procedure(s) Performed: Procedure(s): LAPAROSCOPIC TOTAL ABDOMINAL  COLECTOMY (N/A)  Patient Location: PACU  Anesthesia Type:General  Level of Consciousness: awake, alert , oriented and patient cooperative  Airway and Oxygen Therapy: Patient Spontanous Breathing  Post-op Pain: mild  Post-op Assessment: Post-op Vital signs reviewed, Patient's Cardiovascular Status Stable, Respiratory Function Stable, Patent Airway, No signs of Nausea or vomiting and Pain level controlled  Post-op Vital Signs: Reviewed and stable  Last Vitals:  Filed Vitals:   06/20/14 0506  BP: 118/67  Pulse: 85  Temp: 36.4 C  Resp: 18    Complications: No apparent anesthesia complications

## 2014-06-20 NOTE — Transfer of Care (Signed)
Immediate Anesthesia Transfer of Care Note  Patient: Andrea Lang  Procedure(s) Performed: Procedure(s): LAPAROSCOPIC TOTAL ABDOMINAL  COLECTOMY (N/A)  Patient Location: PACU  Anesthesia Type:General  Level of Consciousness: sedated  Airway & Oxygen Therapy: Patient Spontanous Breathing and Patient connected to face mask oxygen  Post-op Assessment: Report given to RN and Post -op Vital signs reviewed and stable  Post vital signs: Reviewed and stable  Last Vitals:  Filed Vitals:   06/20/14 0506  BP: 118/67  Pulse: 85  Temp: 36.4 C  Resp: 18    Complications: No apparent anesthesia complications

## 2014-06-20 NOTE — H&P (Signed)
History of Present Illness   The patient is a 24 year old female who presents with a colonic polyp. 24 year old female who was seen by Dr. Hilarie Fredrickson for continued rectal bleeding with bowel movements. She had minimal hemorrhoid disease on physical exam and therefore colonoscopy was performed to evaluate this further. Colonoscopy showed significant polyposis (>100) of her ascending, transverse, descending and colon. Her rectum was relatively spared. A polypectomy was performed on 5 polyps. These all showed adenomatous changes. She underwent genetics counseling and testing on March 31. She is still awaiting the results of these tests. She has no family history of colon cancer. Past surgical history is only significant for a cesarean section.       Allergies   No Known Drug Allergies      Family History   First Degree Relatives   No pertinent family history     Social History   Caffeine use: Carbonated beverages, Coffee   No alcohol use   No drug use   Tobacco use: Never smoker     Medication History   Medications Reconciled.     Pregnancy/Birth   Age at menarche: 85 years   Contraceptive History: Intrauterine device   Gravida  4   Irregular periods   Maternal age  66-20   Para  2     Other Problems   Gastroesophageal Reflux Disease     Past Surgical   Cesarean Section - 1   Colon Polyp Removal - Colonoscopy   Tonsillectomy     Diagnostic Studies   Colonoscopy; within last year   Mammogram; never   Pap Smear; 1-5 years ago      Review of Systems   General Present- Weight Gain. Not Present- Appetite Loss, Chills, Fatigue, Fever, Night Sweats and Weight Loss. Skin Not Present- Change in Wart/Mole, Dryness, Hives, Jaundice, New Lesions, Non-Healing Wounds, Rash and Ulcer. HEENT Present- Seasonal Allergies. Not Present- Earache, Hearing Loss, Hoarseness, Nose Bleed, Oral Ulcers, Ringing in the Ears, Sinus Pain, Sore Throat, Visual Disturbances, Wears glasses/contact lenses and  Yellow Eyes. Respiratory Not Present- Bloody sputum, Chronic Cough, Difficulty Breathing, Snoring and Wheezing. Breast Not Present- Breast Mass, Breast Pain, Nipple Discharge and Skin Changes. Cardiovascular Not Present- Chest Pain, Difficulty Breathing Lying Down, Leg Cramps, Palpitations, Rapid Heart Rate, Shortness of Breath and Swelling of Extremities. Gastrointestinal Present- Bloody Stool. Not Present- Abdominal Pain, Bloating, Change in Bowel Habits, Chronic diarrhea, Constipation, Difficulty Swallowing, Excessive gas, Gets full quickly at meals, Hemorrhoids, Indigestion, Nausea, Rectal Pain and Vomiting. Female Genitourinary Not Present- Frequency, Nocturia, Painful Urination, Pelvic Pain and Urgency. Musculoskeletal Not Present- Back Pain, Joint Pain, Joint Stiffness, Muscle Pain, Muscle Weakness and Swelling of Extremities. Neurological Not Present- Decreased Memory, Fainting, Headaches, Numbness, Seizures, Tingling, Tremor, Trouble walking and Weakness. Endocrine Not Present- Cold Intolerance, Excessive Hunger, Hair Changes, Heat Intolerance, Hot flashes and New Diabetes. Hematology Not Present- Easy Bruising, Excessive bleeding, Gland problems, HIV and Persistent Infections.    Vitals   BP 118/67 mmHg  Pulse 85  Temp(Src) 97.5 F (36.4 C) (Oral)  Resp 18  Ht 5\' 7"  (1.702 m)  Wt 100.245 kg (221 lb)  BMI 34.61 kg/m2  SpO2 100%  LMP   Physical Exam  The physical exam findings are as follows:   General Mental Status - Alert.  General Appearance - Consistent with stated age.  Hydration - Well hydrated.  Voice - Normal.    Head and Neck Head - normocephalic, atraumatic with no lesions or palpable masses.  Trachea - midline.  Thyroid - Gland Characteristics - normal size and consistency.    Eye Eyeball - Bilateral - Extraocular movements intact.  Sclera/Conjunctiva - Bilateral - No scleral icterus.    Chest and Lung Exam Chest and lung exam reveals - quiet, even and easy  respiratory effort with no use of accessory muscles and on auscultation, normal breath sounds, no adventitious sounds and normal vocal resonance.  Inspection - Chest Wall - Normal. Back - normal.    Cardiovascular Cardiovascular examination reveals - normal heart sounds, regular rate and rhythm with no murmurs and normal pedal pulses bilaterally.    Abdomen Inspection - Inspection of the abdomen reveals - No Hernias.  Palpation/Percussion - Palpation and Percussion of the abdomen reveal - Soft, Non Tender, No Rebound tenderness, No Rigidity (guarding) and No hepatosplenomegaly.  Auscultation - Auscultation of the abdomen reveals - Bowel sounds normal.    Neurologic Neurologic evaluation reveals - alert and oriented x 3 with no impairment of recent or remote memory.  Mental Status - Normal.    Musculoskeletal Global Assessment Normal Exam - Left - Upper Extremity Strength Normal and Lower Extremity Strength Normal.  Normal Exam - Right - Upper Extremity Strength Normal and Lower Extremity Strength Normal.     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 I have recommended total abd colectomy with ileorectal anastomosis. This will require yearly evaluation of the rectum with endoscopic exams. She understands that she may possibly need a proctectomy in the future with possible J-pouch. We discussed possibly doing this with her surgery if she were positive for FAP to reduce her future cancer risk. The negative risk of this would be possible problems with fertility in the future. She does not wish to have a proctectomy at this point.  Of note, pt was neg for any known genetic abnormality.   The surgery and anatomy were described to the patient as well as the risks of surgery and the possible complications. These include: Bleeding, deep abdominal infections and possible wound  complications such as hernia and infection, damage to adjacent structures, leak of surgical connections, which can lead to other surgeries and possibly an ostomy, possible need for other procedures, such as abscess drains in radiology, possible prolonged hospital stay, probable diarrhea from removal of part of the colon, prolonged fatigue/weakness or appetite loss, possible early recurrence of of disease, possible complications of their medical problems such as heart disease or arrhythmias or lung problems, death (less than 1%). I believe the patient understands and wishes to proceed with the surgery.

## 2014-06-20 NOTE — Anesthesia Preprocedure Evaluation (Addendum)
Anesthesia Evaluation  Patient identified by MRN, date of birth, ID band Patient awake    Reviewed: Allergy & Precautions, NPO status , Patient's Chart, lab work & pertinent test results  History of Anesthesia Complications Negative for: history of anesthetic complications  Airway Mallampati: I  TM Distance: >3 FB Neck ROM: Full    Dental  (+) Dental Advisory Given   Pulmonary asthma (inhaler infrequently) ,  breath sounds clear to auscultation        Cardiovascular negative cardio ROS  Rhythm:Regular Rate:Normal     Neuro/Psych negative neurological ROS     GI/Hepatic Neg liver ROS, GERD-  Poorly Controlled,Polyposis: for colectomy today   Endo/Other  Morbid obesity  Renal/GU negative Renal ROS     Musculoskeletal   Abdominal (+) + obese,   Peds  Hematology   Anesthesia Other Findings   Reproductive/Obstetrics 06/14/14 preg test: NEG                            Anesthesia Physical Anesthesia Plan  ASA: II  Anesthesia Plan: General   Post-op Pain Management:    Induction: Intravenous and Cricoid pressure planned  Airway Management Planned: Oral ETT  Additional Equipment:   Intra-op Plan:   Post-operative Plan: Extubation in OR  Informed Consent: I have reviewed the patients History and Physical, chart, labs and discussed the procedure including the risks, benefits and alternatives for the proposed anesthesia with the patient or authorized representative who has indicated his/her understanding and acceptance.   Dental advisory given  Plan Discussed with: CRNA and Surgeon  Anesthesia Plan Comments: (Plan routine monitors, GETA)        Anesthesia Quick Evaluation

## 2014-06-20 NOTE — Op Note (Signed)
06/20/2014  11:18 AM  PATIENT:  Andrea Lang  24 y.o. female  Patient Care Team: Berkley Harvey, NP as PCP - General (Nurse Practitioner)  PRE-OPERATIVE DIAGNOSIS:  colonic polyposis  POST-OPERATIVE DIAGNOSIS:  colonic polyposis  PROCEDURE:   LAPAROSCOPIC TOTAL ABDOMINAL  COLECTOMY  Surgeon(s): [name], MD  ASSISTANT: none   ANESTHESIA:   general  EBL:  Total I/O In: 2000 [I.V.:2000] Out: 275 [Urine:275]  SPECIMEN:  Source of Specimen:  total colon  DISPOSITION OF SPECIMEN:  PATHOLOGY  COUNTS:  YES  PLAN OF CARE: Admit to inpatient   PATIENT DISPOSITION:  PACU - hemodynamically stable.   INDICATIONS: This is a 24 y.o. female who presented to my office with colonic polyposis of her sigmoid, descending, transverse and ascending colon. I have recommended total abdominal colectomy.  The risk and benefits and alternative treatments were explained to the patient prior to the OR and the patient has elected to proceed with the surgery.  Consent was signed and placed on chart prior to the OR.  OR FINDINGS:  Normal appearing anatomy.  No signs of malignancy.  Small bowel polyposis in terminal ileum  DESCRIPTION:  The patient was identified & brought into the operating room. The patient was positioned supine with both arms tucked. SCDs were active during the entire case. The patient underwent general anesthesia without any difficulty. A foley catheter was inserted under sterile conditions. The abdomen was prepped and draped in a sterile fashion. A Surgical Timeout confirmed our plan.  I made a horizontal incision through the superior umbilical fold. I dissected down through the subcutaneous tissues using blunt dissection and elevated the fascia with 2 Kocher clamps.  The fascia was incised with a scalpel.  Blunt dissection was used to obtain peritoneal entry. I placed a stay suture and then the [facility] port. We induced carbon dioxide insufflation. Camera inspection revealed no  injury. I placed additional ports under direct laparoscopic visualization.  I evaluated the entire abdomen laparoscopically.  The liver appeared normal, the large and small bowel were normal as well.  There were no signs of metastatic disease.  I began by mobilizing the omentum off of the transverse colon using the bipolar Enseal device. After this was completed I began to mobilize the cecum and appendix from the peritoneal attachments and slowly began to mobilize the right colon medially taking down the peritoneal attachments to the sidewall as I went. The hepatic flexure was taken down using the Enseal device. I dissected the right colon free from the retroperitoneal structures and then began to divide the gastrocolic ligament using the Enseal device.  I brought this all the way to the splenic flexure. I then mobilized the left colon in similar fashion taking down the sigmoid peritoneal attachments and separating it from the retroperitoneal structures. The splenic flexure was taken down also using the Enseal device. After this was completed I found the terminal ileum and I divided the mesentery next to the ileocolic artery. I then transected the ileocolic artery using the bipolar Enseal device.I continued to divide the mesentery with the Enseal device. I divided the right branch of the middle colic middle colic and left branch of the middle colic in similar fashion.  I stayed close to the colon, given this was not a cancer resection.  I continued to divide the mesentery down to the level of the rectosigmoid junction.  After this was completed and the colon was completely free from all attachments I made a Pfannenstiel incision  in standard fashion.I placed an IT sales professional. We brought out the cecum and terminal ileum and transected the terminal ileum with a GIA blue load stapler.I then removed the remaining portion of the colon down to the rectosigmoid junction.The ileocolic artery was divided using cut  and clamp technique with a 2-0 silk suture.  I transected the colon at the rectosigmoid junction using a GIA blue load stapler.  I then brought a 29 mm EEA stapler onto the field.The terminal ileum staple line was opened using electrocautery.  I placed the anvil inside and brought this out approximately 6 cm proximal to the open end of the terminal ileum.  I used the green spike to bring out the anvil on the antimesenteric border of the small bowel.  The distal portion of the small bowel was stapled closed using a GIA blue load stapler.   At that point, I placed the EEA  Stapler into the patient's rectum and brought this out through the distal staple line. An anastomosis was created without tension.  This was tested underwater and revealed no leak.The pelvis was irrigated.  Hemostasis was good.  The Alexis and all ports were removed. We switched to clean gowns, gloves, drapes and instruments. i closed the peritoneum using a running 2-0 Vicryl suture.  I then closed the fascia using  #1 PDS sutures 2 in a running fashion.  The subcutaneous tissue was irrigated and then reapproximated using interrupted 2-0 Vicryl sutures.  The skin was then closed using 4-0 Vicryl sutures. Dermabond was placed on the port sites and a sterile dressing was placed over the abdominal incision. All counts were correct per operating room staff. The patient was then awakened from anesthesia and sent to the post anesthesia care unit in stable condition.

## 2014-06-20 NOTE — Anesthesia Procedure Notes (Signed)
Procedure Name: Intubation Date/Time: 06/20/2014 7:27 AM Performed by: Danley Danker L Patient Re-evaluated:Patient Re-evaluated prior to inductionOxygen Delivery Method: Circle system utilized Preoxygenation: Pre-oxygenation with 100% oxygen Intubation Type: IV induction and Cricoid Pressure applied Ventilation: Mask ventilation without difficulty and Oral airway inserted - appropriate to patient size Laryngoscope Size: Sabra Heck and 2 Grade View: Grade I Tube type: Oral Tube size: 7.5 mm Number of attempts: 1 Airway Equipment and Method: Stylet Placement Confirmation: ETT inserted through vocal cords under direct vision,  breath sounds checked- equal and bilateral and positive ETCO2 Secured at: 21 cm Tube secured with: Tape Dental Injury: Teeth and Oropharynx as per pre-operative assessment

## 2014-06-21 ENCOUNTER — Encounter (HOSPITAL_COMMUNITY): Payer: Self-pay | Admitting: General Surgery

## 2014-06-21 LAB — CBC
HCT: 35.7 % — ABNORMAL LOW (ref 36.0–46.0)
Hemoglobin: 11.7 g/dL — ABNORMAL LOW (ref 12.0–15.0)
MCH: 28.3 pg (ref 26.0–34.0)
MCHC: 32.8 g/dL (ref 30.0–36.0)
MCV: 86.4 fL (ref 78.0–100.0)
Platelets: 236 10*3/uL (ref 150–400)
RBC: 4.13 MIL/uL (ref 3.87–5.11)
RDW: 12.4 % (ref 11.5–15.5)
WBC: 12.5 10*3/uL — ABNORMAL HIGH (ref 4.0–10.5)

## 2014-06-21 LAB — BASIC METABOLIC PANEL
Anion gap: 8 (ref 5–15)
BUN: 6 mg/dL (ref 6–20)
CO2: 26 mmol/L (ref 22–32)
Calcium: 8.5 mg/dL — ABNORMAL LOW (ref 8.9–10.3)
Chloride: 106 mmol/L (ref 101–111)
Creatinine, Ser: 0.57 mg/dL (ref 0.44–1.00)
GFR calc Af Amer: >60 mL/min (ref >60)
GFR calc non Af Amer: >60 mL/min (ref >60)
Glucose, Bld: 139 mg/dL — ABNORMAL HIGH (ref 65–99)
Potassium: 3.6 mmol/L (ref 3.5–5.1)
Sodium: 140 mmol/L (ref 135–145)

## 2014-06-21 NOTE — Progress Notes (Signed)
Pt is now calm and states that pain has gone down to 4/10 since receiving PCA. Will continue to monitor pt.

## 2014-06-21 NOTE — Progress Notes (Signed)
Patient ID: Andrea Lang, female   DOB: 1990/12/10, 24 y.o.   MRN: [id number] 1 Day Post-Op Lap total abdominal colectomy Subjective: Pt with significant pain and nausea last night.  This improved when PCA added.  Denies nausea this am.  Pain controlled.   Objective: Vital signs in last 24 hours: Temp:  [97.5 F (36.4 C)-98.8 F (37.1 C)] 98 F (36.7 C) (05/26 0641) Pulse Rate:  [67-116] 67 (05/26 0641) Resp:  [10-20] 18 (05/26 0742) BP: (115-133)/(53-99) 120/57 mmHg (05/26 0641) SpO2:  [100 %] 100 % (05/26 0742)   Intake/Output from previous day: 05/25 0701 - 05/26 0700 In: 4597.5 [P.O.:30; I.V.:4417.5; IV Piggyback:150] Out: 3716 [Urine:3225; Blood:50] Intake/Output this shift:     General appearance: alert and cooperative GI: normal findings: soft, non-tender, non-distended  Incision: no significant drainage  Lab Results:   Recent Labs  06/21/14 0419  WBC 12.5*  HGB 11.7*  HCT 35.7*  PLT 236   BMET  Recent Labs  06/21/14 0419  NA 140  K 3.6  CL 106  CO2 26  GLUCOSE 139*  BUN 6  CREATININE 0.57  CALCIUM 8.5*   PT/INR No results for input(s): LABPROT, INR in the last 72 hours. ABG No results for input(s): PHART, HCO3 in the last 72 hours.  Invalid input(s): PCO2, PO2  MEDS, Scheduled . acetaminophen  1,000 mg Oral 4 times per day  . alvimopan  12 mg Oral BID  . enoxaparin (LOVENOX) injection  40 mg Subcutaneous Q24H  . ketorolac  15 mg Intravenous 4 times per day  . morphine   Intravenous 6 times per day    Studies/Results: No results found.  Assessment: s/p Procedure(s): LAPAROSCOPIC TOTAL ABDOMINAL  COLECTOMY Patient Active Problem List   Diagnosis Date Noted  . 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 History of colonic polyps 04/26/2014  . Rectal bleeding 01/18/2014    Expected post op course  Plan: d/c foley Advance diet to clears Ambulate TID Cont PCA, scheduled toradol for pain control   LOS: 1 day     .Rosario Adie, MD [facility], [place]   06/21/2014 9:07 AM

## 2014-06-21 NOTE — Progress Notes (Signed)
Pt complained of pain that was not resolved by pain meds. MD on call notified. New orders received for one time dose of morphine. Administered medication to pt. Pt continued to complain of pain and was rating it an 8/10. Paged MD on call again to update on pt's status. New orders received for reduced dose morphine PCA. Will continue to monitor.

## 2014-06-22 LAB — CBC
HCT: 32.9 % — ABNORMAL LOW (ref 36.0–46.0)
Hemoglobin: 10.6 g/dL — ABNORMAL LOW (ref 12.0–15.0)
MCH: 28.3 pg (ref 26.0–34.0)
MCHC: 32.2 g/dL (ref 30.0–36.0)
MCV: 88 fL (ref 78.0–100.0)
Platelets: 182 10*3/uL (ref 150–400)
RBC: 3.74 MIL/uL — ABNORMAL LOW (ref 3.87–5.11)
RDW: 12.6 % (ref 11.5–15.5)
WBC: 8.5 10*3/uL (ref 4.0–10.5)

## 2014-06-22 LAB — BASIC METABOLIC PANEL
Anion gap: 7 (ref 5–15)
BUN: 8 mg/dL (ref 6–20)
CO2: 27 mmol/L (ref 22–32)
Calcium: 8.3 mg/dL — ABNORMAL LOW (ref 8.9–10.3)
Chloride: 105 mmol/L (ref 101–111)
Creatinine, Ser: 0.47 mg/dL (ref 0.44–1.00)
GFR calc Af Amer: >60 mL/min (ref >60)
GFR calc non Af Amer: >60 mL/min (ref >60)
Glucose, Bld: 103 mg/dL — ABNORMAL HIGH (ref 65–99)
Potassium: 3.6 mmol/L (ref 3.5–5.1)
Sodium: 139 mmol/L (ref 135–145)

## 2014-06-22 NOTE — Progress Notes (Signed)
Patient ID: Andrea Lang, female   DOB: 27-Oct-1990, 24 y.o.   MRN: [id number] 2 Days Post-Op Lap total abdominal colectomy with IRA Subjective: Tolerated clears.  Denies nausea, having Bm's  Pain controlled.   Objective: Vital signs in last 24 hours: Temp:  [97.7 F (36.5 C)-98.1 F (36.7 C)] 97.8 F (36.6 C) (05/27 0525) Pulse Rate:  [59-81] 81 (05/27 0525) Resp:  [12-17] 14 (05/27 0821) BP: (108-130)/(57-72) 110/57 mmHg (05/27 0525) SpO2:  [98 %-100 %] 100 % (05/27 0821)   Intake/Output from previous day: 05/26 0701 - 05/27 0700 In: 1718.8 [P.O.:120; I.V.:1598.8] Out: 2700 [Urine:2700] Intake/Output this shift:   General appearance: alert and cooperative GI: normal findings: soft, non-tender, non-distended  Incision: no significant drainage  Lab Results:   Recent Labs  06/21/14 0419 06/22/14 0435  WBC 12.5* 8.5  HGB 11.7* 10.6*  HCT 35.7* 32.9*  PLT 236 182   BMET  Recent Labs  06/21/14 0419 06/22/14 0435  NA 140 139  K 3.6 3.6  CL 106 105  CO2 26 27  GLUCOSE 139* 103*  BUN 6 8  CREATININE 0.57 0.47  CALCIUM 8.5* 8.3*   PT/INR No results for input(s): LABPROT, INR in the last 72 hours. ABG No results for input(s): PHART, HCO3 in the last 72 hours.  Invalid input(s): PCO2, PO2  MEDS, Scheduled . enoxaparin (LOVENOX) injection  40 mg Subcutaneous Q24H    Studies/Results: No results found.  Assessment: s/p Procedure(s): LAPAROSCOPIC TOTAL ABDOMINAL  COLECTOMY Patient Active Problem List   Diagnosis Date Noted  . 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 History of colonic polyps 04/26/2014  . Rectal bleeding 01/18/2014    Expected post op course  Plan: Advance diet to regular Ambulate TID D/c PCA, PO percocet and IV morphine for breakthrough   LOS: 2 days     .Rosario Adie, [facility], [place]   06/22/2014 8:55 AM

## 2014-06-23 LAB — CBC
HCT: 35.1 % — ABNORMAL LOW (ref 36.0–46.0)
Hemoglobin: 11.4 g/dL — ABNORMAL LOW (ref 12.0–15.0)
MCH: 28.2 pg (ref 26.0–34.0)
MCHC: 32.5 g/dL (ref 30.0–36.0)
MCV: 86.9 fL (ref 78.0–100.0)
Platelets: 213 10*3/uL (ref 150–400)
RBC: 4.04 MIL/uL (ref 3.87–5.11)
RDW: 12.5 % (ref 11.5–15.5)
WBC: 7.9 10*3/uL (ref 4.0–10.5)

## 2014-06-23 LAB — BASIC METABOLIC PANEL
Anion gap: 8 (ref 5–15)
BUN: 12 mg/dL (ref 6–20)
CO2: 27 mmol/L (ref 22–32)
Calcium: 8.3 mg/dL — ABNORMAL LOW (ref 8.9–10.3)
Chloride: 103 mmol/L (ref 101–111)
Creatinine, Ser: 0.48 mg/dL (ref 0.44–1.00)
GFR calc Af Amer: >60 mL/min (ref >60)
GFR calc non Af Amer: >60 mL/min (ref >60)
Glucose, Bld: 98 mg/dL (ref 65–99)
Potassium: 3.5 mmol/L (ref 3.5–5.1)
Sodium: 138 mmol/L (ref 135–145)

## 2014-06-23 MED ORDER — OXYCODONE-ACETAMINOPHEN 5-325 MG PO TABS: 1.0000 | ORAL_TABLET | ORAL | Status: DC | PRN

## 2014-06-23 NOTE — Discharge Summary (Signed)
  Patient ID: Andrea Lang [id number] 23 y.o. 08/19/1990  06/20/2014  Discharge date and time: 06/23/2014  3:15 PM  Admitting Physician: Rosario Adie.  Discharge Physician: Rosario Adie.  Admission Diagnoses: colonic polyposis  Discharge Diagnoses: colonic polyposis  Operations: Procedure(s): LAPAROSCOPIC TOTAL ABDOMINAL  COLECTOMY    Discharged Condition: good    Hospital Course: Patient admitted after surgery.  Her pain was an issue on POD 0 but resolved with a PCA.  Her foley was removed on POD 2.  Her diet was advanced as tolerated.  By POD 4, she was tolerating a diet and having BM's.  Her pain was controlled with PO meds and she was ambulating without difficulty.    Consults: None  Significant Diagnostic Studies: labs: cbc, chemistry   Treatments: IV hydration, analgesia: acetaminophen w/ codeine and surgery: see above  Disposition: Home

## 2014-06-23 NOTE — Progress Notes (Signed)
Pt desires discharge. She is ambulating walking and voiding without difficulty. States pain relief with oral meds adequate. Dr Marcello Moores called. Order noted. Discharge instructions discussed with patient until all questions answered. Am assessment unchanged except  Dressing removed by md.  IV discontinued.

## 2014-06-23 NOTE — Progress Notes (Signed)
Patient ID: Andrea Lang, female   DOB: [birth year], 24 y.o.   MRN: [id number] 3 Days Post-Op Lap total abdominal colectomy with IRA Subjective: Tolerated soft diet.  Denies nausea, having Bm's  Pain controlled.  Ambulating.  Feels tired with activity  Objective: Vital signs in last 24 hours: Temp:  [97.5 F (36.4 C)-98.6 F (37 C)] 98.6 F (37 C) (05/28 0640) Pulse Rate:  [68-77] 74 (05/28 0640) Resp:  [16] 16 (05/28 0640) BP: (110-126)/(50-71) 126/55 mmHg (05/28 0640) SpO2:  [99 %-100 %] 100 % (05/28 0640)   Intake/Output from previous day: 05/27 0701 - 05/28 0700 In: 1416.7 [P.O.:1080; I.V.:336.7] Out: 1950 [Urine:1950] Intake/Output this shift:   General appearance: alert and cooperative GI: normal findings: soft, non-tender, non-distended  Incision: no significant drainage  Lab Results:   Recent Labs  06/22/14 0435 06/23/14 0515  WBC 8.5 7.9  HGB 10.6* 11.4*  HCT 32.9* 35.1*  PLT 182 213   BMET  Recent Labs  06/22/14 0435 06/23/14 0515  NA 139 138  K 3.6 3.5  CL 105 103  CO2 27 27  GLUCOSE 103* 98  BUN 8 12  CREATININE 0.47 0.48  CALCIUM 8.3* 8.3*   PT/INR No results for input(s): LABPROT, INR in the last 72 hours. ABG No results for input(s): PHART, HCO3 in the last 72 hours.  Invalid input(s): PCO2, PO2  MEDS, Scheduled . enoxaparin (LOVENOX) injection  40 mg Subcutaneous Q24H    Studies/Results: No results found.  Assessment: s/p Procedure(s): LAPAROSCOPIC TOTAL ABDOMINAL  COLECTOMY Patient Active Problem List   Diagnosis Date Noted  . 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 History of colonic polyps 04/26/2014  . Rectal bleeding 01/18/2014    Expected post op course  Plan: Cont regular diet Ambulate TID Cont PO percocet and IV morphine for breakthrough Possible D/C later today if pt feeling ok   LOS: 3 days     .Rosario Adie, [facility], [place] [phone]   06/23/2014 8:38 AM

## 2014-06-23 NOTE — Discharge Instructions (Signed)
ABDOMINAL SURGERY: POST OP INSTRUCTIONS  1. DIET: Follow a light bland diet the first 24 hours after arrival home, such as soup, liquids, crackers, etc.  Be sure to include lots of fluids daily.  Avoid fast food or heavy meals as your are more likely to get nauseated.  Do not eat any uncooked fruits or vegetables for the next 2 weeks as your colon heals. 2. Take your usually prescribed home medications unless otherwise directed. 3. PAIN CONTROL: a. Pain is best controlled by a usual combination of three different methods TOGETHER: i. Ice/Heat ii. Over the counter pain medication iii. Prescription pain medication b. Most patients will experience some swelling and bruising around the incisions.  Ice packs or heating pads (30-60 minutes up to 6 times a day) will help. Use ice for the first few days to help decrease swelling and bruising, then switch to heat to help relax tight/sore spots and speed recovery.  Some people prefer to use ice alone, heat alone, alternating between ice & heat.  Experiment to what works for you.  Swelling and bruising can take several weeks to resolve.   c. It is helpful to take an over-the-counter pain medication regularly for the first few weeks.  Choose one of the following that works best for you: i. Naproxen (Aleve, etc)  Two 220mg  tabs twice a day ii. Ibuprofen (Advil, etc) Three 200mg  tabs four times a day (every meal & bedtime) iii. Acetaminophen (Tylenol, etc) 500-650mg  four times a day (every meal & bedtime) d. A  prescription for pain medication (such as oxycodone, hydrocodone, etc) should be given to you upon discharge.  Take your pain medication as prescribed.  i. If you are having problems/concerns with the prescription medicine (does not control pain, nausea, vomiting, rash, itching, etc), please call us [phone] to see if we need to switch you to a different pain medicine that will work better for you and/or control your side effect better. ii. If you  need a refill on your pain medication, please contact your pharmacy.  They will contact our office to request authorization. Prescriptions will not be filled after 5 pm or on week-ends. 4. Watch out for diarrhea.  If you have many loose bowel movements, simplify your diet to bland foods & liquids for a few days.  Stop any stool softeners and decrease your fiber supplement.  Switching to mild anti-diarrheal medications (Kayopectate, Pepto Bismol) can help.  If this worsens or does not improve, please call us. 5. Wash / shower every day.  You may shower over the incision / wound.  Avoid baths until the skin is fully healed.  Continue to shower over incision(s) after the dressing is off. 6. Remove your waterproof bandages 5 days after surgery.  You may leave the incision open to air.  You may replace a dressing/Band-Aid to cover the incision for comfort if you wish. 7. ACTIVITIES as tolerated:   a. You may resume regular (light) daily activities beginning the next day--such as daily self-care, walking, climbing stairs--gradually increasing activities as tolerated.  If you can walk 30 minutes without difficulty, it is safe to try more intense activity such as jogging, treadmill, bicycling, low-impact aerobics, swimming, etc. b. Save the most intensive and strenuous activity for last such as sit-ups, heavy lifting, contact sports, etc  Refrain from any heavy lifting or straining until you are off narcotics for pain control.   c. DO NOT PUSH THROUGH PAIN.  Let pain be your guide: If it  hurts to do something, don't do it.  Pain is your body warning you to avoid that activity for another week until the pain goes down. d. You may drive when you are no longer taking prescription pain medication, you can comfortably wear a seatbelt, and you can safely maneuver your car and apply brakes. e. Dennis Bast may have sexual intercourse when it is comfortable.  8. FOLLOW UP in our office a. Please call [facility] at [phone] to set  up an appointment to see your surgeon in the office for a follow-up appointment approximately 1-2 weeks after your surgery. b. Make sure that you call for this appointment the day you arrive home to insure a convenient appointment time. 10. IF YOU HAVE DISABILITY OR FAMILY LEAVE FORMS, BRING THEM TO THE OFFICE FOR PROCESSING.  DO NOT GIVE THEM TO YOUR DOCTOR.   WHEN TO CALL us [phone]: 1. Poor pain control 2. Reactions / problems with new medications (rash/itching, nausea, etc)  3. Fever over 101.5 F (38.5 C) 4. Inability to urinate 5. Nausea and/or vomiting 6. Worsening swelling or bruising 7. Continued bleeding from incision. 8. Increased pain, redness, or drainage from the incision  The clinic staff is available to answer your questions during regular business hours (8:30am-5pm).  Please dont hesitate to call and ask to speak to one of our nurses for clinical concerns.   A surgeon from [facility] is always on call at the hospitals   If you have a medical emergency, go to the nearest emergency room or call 911.    [facility], [place], [place], [place], [place]  [address] ? MAIN: [phone] ? TOLL FREE: [phone] ? FAX [phone] [email or web address]

## 2014-07-10 ENCOUNTER — Telehealth: Payer: Self-pay | Admitting: Internal Medicine

## 2014-07-10 NOTE — Telephone Encounter (Signed)
OV 1st please

## 2014-07-10 NOTE — Telephone Encounter (Signed)
Just to clarify, does she need an OV with you before the EGD?

## 2014-07-10 NOTE — Telephone Encounter (Signed)
Dr. Hilarie Fredrickson are you ok with me scheduling pt for EGD only or do you want her seen? Please advise. Dr. Marcello Moores requests, see note below.

## 2014-07-10 NOTE — Telephone Encounter (Signed)
Can be scheduled for EGD, but also needs OV with me.  Will need annual surveillance of rectal mucosa also

## 2014-07-11 ENCOUNTER — Other Ambulatory Visit: Payer: Self-pay

## 2014-07-11 NOTE — Telephone Encounter (Signed)
Your first opening is 09/10/14. I have not seen the notes. I can schedule her with an extender. That will make the appointment in about 2 weeks. Is that acceptable?

## 2014-07-11 NOTE — Telephone Encounter (Signed)
It is okay to wait to see me

## 2014-07-11 NOTE — Telephone Encounter (Signed)
Spoke with the patient. She agrees to the plan and will see Dr Hilarie Fredrickson in August. No procedure scheduled.

## 2014-07-16 ENCOUNTER — Telehealth: Payer: Self-pay | Admitting: *Deleted

## 2014-07-16 NOTE — Telephone Encounter (Signed)
Patient is scheduled on 09/12/14 with Dr. Hilarie Fredrickson.

## 2014-07-16 NOTE — Telephone Encounter (Signed)
-----   Message from Jerene Bears, MD sent at 07/10/2014  1:40 PM EDT ----- I think EGD is very reasonable Thanks Margrett Rud, Patient needs OV (next available) with me to discuss EGD Thanks JMP  ----- Message -----    From: [name], MD    Sent: 07/10/2014  10:40 AM      To: Jerene Bears, MD  I am sending her back to you.  She did well with her colectomy.  She has an ileorectal anastomosis.  She did have quite a few polyps in her TI, which makes me worried for duodenal polyps.  Was going to see what you thought about doing an EGD even though she does not have true FAP.  Elmo Putt

## 2014-08-31 ENCOUNTER — Encounter: Payer: Self-pay | Admitting: *Deleted

## 2014-09-10 ENCOUNTER — Ambulatory Visit: Payer: BLUE CROSS/BLUE SHIELD | Admitting: Internal Medicine

## 2014-09-12 ENCOUNTER — Encounter: Payer: Self-pay | Admitting: Internal Medicine

## 2014-09-12 ENCOUNTER — Ambulatory Visit (INDEPENDENT_AMBULATORY_CARE_PROVIDER_SITE_OTHER): Payer: BLUE CROSS/BLUE SHIELD | Admitting: Internal Medicine

## 2014-09-12 VITALS — BP 128/66 | HR 72 | Ht 66.54 in | Wt 213.5 lb

## 2014-09-12 DIAGNOSIS — Z9889 Other specified postprocedural states: Secondary | ICD-10-CM | POA: Diagnosis not present

## 2014-09-12 DIAGNOSIS — K635 Polyp of colon: Secondary | ICD-10-CM

## 2014-09-12 DIAGNOSIS — Z9049 Acquired absence of other specified parts of digestive tract: Secondary | ICD-10-CM

## 2014-09-12 DIAGNOSIS — D126 Benign neoplasm of colon, unspecified: Secondary | ICD-10-CM | POA: Diagnosis not present

## 2014-09-12 NOTE — Patient Instructions (Signed)
You have been scheduled for an endoscopy. Please follow written instructions given to you at your visit today. If you use inhalers (even only as needed), please bring them with you on the day of your procedure. Your physician has requested that you go to www.[email or web address].com and enter the access code given to you at your visit today. This web site gives a general overview about your procedure. However, you should still follow specific instructions given to you by our office regarding your preparation for the procedure.  You will be due for a recall colonoscopy or flexible sigmoidoscopy in 05/2015. We will send you a reminder in the mail when it gets closer to that time.

## 2014-09-12 NOTE — Progress Notes (Signed)
Subjective:    Patient ID: Andrea Lang, female    DOB: Mar 01, 1990, 24 y.o.   MRN: [id number]  HPI Andrea Lang is a 24 year old female initially sent to me for rectal bleeding who had colonoscopy found to have numerous adenomatous colon polyps. She was sent for genetic testing and to Dr. [name] to consider colectomy. Genetic testing was performed and FAP was not found but a nonspecific genetic abnormality was identified in the [facility] 2 gene.  She had subtotal colectomy on 06/20/2014 with Dr. Marcello Moores. Recovery was uneventful other than a postoperative wound infection treated with oral antibiotics. She has recovered well and is having 2-3 bowel movements per day. No blood in her stool. Appetite is returning to normal. If she overeats she occasionally has epigastric discomfort but she denies frequent heartburn, dysphagia or odynophagia. Denies nausea and vomiting. Her life is busy with her 9-year-old and 6-year-old son. Both of her children are with her today.  Pathology results reviewed from subtotal colectomy. Multiple adenomatous colon polyps several with high-grade dysplasia were found throughout the colon. It was estimated greater than 50 adenomas. No cancer and negative margins. All lymph nodes benign.   Review of Systems As per HPI, otherwise negative  Current Medications, Allergies, Past Medical History, Past Surgical History, Family History and Social History were reviewed in [employer] record.     Objective:   Physical Exam BP 128/66 mmHg  Pulse 72  Ht 5' 6.53" (1.69 m)  Wt 213 lb 8 oz (96.843 kg)  BMI 33.91 kg/m2 Constitutional: Well-developed and well-nourished. No distress. HEENT: Normocephalic and atraumatic. Conjunctivae are normal.  No scleral icterus. Neck: Neck supple. Trachea midline. Cardiovascular: Normal rate, regular rhythm and intact distal pulses. No M/R/G Pulmonary/chest: Effort normal and breath sounds normal. No wheezing, rales or  rhonchi. Abdominal: Soft, nontender, nondistended. Bowel sounds active throughout.  Extremities: no clubbing, cyanosis, or edema Lymphadenopathy: No cervical adenopathy noted. Neurological: Alert and oriented to person place and time. Skin: Skin is warm and dry. No rashes noted. Psychiatric: Normal mood and affect. Behavior is normal.  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 General Interpretation: High risk follow up warranted based on personal history of multiple colon polyps  HPI: Andrea Lang was previously seen in the [facility] due to concerns regarding a hereditary predisposition to cancer. Please refer to our prior cancer genetics clinic note for more information regarding Andrea Lang medical, social and family histories, and our assessment and recommendations, at the time. Andrea Lang genetic test results and recommendations warranted by these results were recently disclosed to her and are discussed in more detail below.  GENETIC TEST RESULTS: At the time of Andrea Lang visit, we recommended she pursue genetic testing, which includes sequencing and deletion/duplication analysis of several genes  associated with an increased risk for cancer via a gene panel. The ColoNext gene panel offered by [employer] includes sequencing and  rearrangement analysis for the following genes: APC, BMPR1A, CDH1, CHEK2, EPCAM, GREM1, MLH1, MSH2, MSH6, MUTYH, PMS2, POLD1, POLE, PTEN, SMAD4, STK11, and TP53. Genetic testing for this gene panel was normal and did not reveal a pathogenic mutation in any of these genes. A copy of the genetic test report will be scanned into Epic under the media tab.  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 At this time, it is unknown if this variant is associated with an increased risk for cancer or if this is a normal finding. With time, we suspect the lab will reclassify this variant and when they do, we will try to re-contact Andrea Lang to discuss the reclassification further.     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 I recommend annual flexible sigmoidoscopy to survey the rectum and perform polypectomy when necessary. Plan flexible sigmoidoscopy May 2017. Agree with Dr. Manon Hilding suggestion for upper endoscopy to rule out polyps in the stomach and proximal duodenum. Patients with FAP are predisposed to upper GI polyps and while she does not have FAP certainly she has a genetic abnormality leading to multiple adenomatous colon polyps. I have recommended that her children again colonoscopy around age 56 with pediatric gastroenterology. Her first degree family members, other than her children, should begin screening colonoscopy now. She is aware of this recommendation. We discussed the risks, benefits and alternatives to upper endoscopy and she is agreeable to proceed. It is being scheduled now.

## 2014-10-29 ENCOUNTER — Ambulatory Visit (AMBULATORY_SURGERY_CENTER): Payer: BLUE CROSS/BLUE SHIELD | Admitting: Internal Medicine

## 2014-10-29 ENCOUNTER — Encounter: Payer: Self-pay | Admitting: Internal Medicine

## 2014-10-29 VITALS — BP 120/62 | HR 60 | Temp 97.7°F | Resp 12 | Ht 66.0 in | Wt 213.0 lb

## 2014-10-29 DIAGNOSIS — D126 Benign neoplasm of colon, unspecified: Secondary | ICD-10-CM

## 2014-10-29 DIAGNOSIS — Z86010 Personal history of colonic polyps: Secondary | ICD-10-CM

## 2014-10-29 DIAGNOSIS — K253 Acute gastric ulcer without hemorrhage or perforation: Secondary | ICD-10-CM

## 2014-10-29 MED ORDER — SODIUM CHLORIDE 0.9 % IV SOLN: 500.0000 mL | INTRAVENOUS | Status: DC

## 2014-10-29 NOTE — Progress Notes (Signed)
No problems noted in the recovery room. maw 

## 2014-10-29 NOTE — Progress Notes (Signed)
Called to room to assist during endoscopic procedure.  Patient ID and intended procedure confirmed with present staff. Received instructions for my participation in the procedure from the performing physician.  

## 2014-10-29 NOTE — Patient Instructions (Addendum)
YOU HAD AN ENDOSCOPIC PROCEDURE TODAY AT [facility]:   Refer to the procedure report that was given to you for any specific questions about what was found during the examination.  If the procedure report does not answer your questions, please call your gastroenterologist to clarify.  If you requested that your care partner not be given the details of your procedure findings, then the procedure report has been included in a sealed envelope for you to review at your convenience later.  YOU SHOULD EXPECT: Some feelings of bloating in the abdomen. Passage of more gas than usual.  Walking can help get rid of the air that was put into your GI tract during the procedure and reduce the bloating. If you had a lower endoscopy (such as a colonoscopy or flexible sigmoidoscopy) you may notice spotting of blood in your stool or on the toilet paper. If you underwent a bowel prep for your procedure, you may not have a normal bowel movement for a few days.  Please Note:  You might notice some irritation and congestion in your nose or some drainage.  This is from the oxygen used during your procedure.  There is no need for concern and it should clear up in a day or so.  SYMPTOMS TO REPORT IMMEDIATELY:    Following upper endoscopy (EGD)  Vomiting of blood or coffee ground material  New chest pain or pain under the shoulder blades  Painful or persistently difficult swallowing  New shortness of breath  Fever of 100F or higher  Black, tarry-looking stools  For urgent or emergent issues, a gastroenterologist can be reached at any hour by calling [phone].   DIET: Your first meal following the procedure should be a small meal and then it is ok to progress to your normal diet. Heavy or fried foods are harder to digest and may make you feel nauseous or bloated.  Likewise, meals heavy in dairy and vegetables can increase bloating.  Drink plenty of fluids but you should avoid alcoholic beverages  for 24 hours.  ACTIVITY:  You should plan to take it easy for the rest of today and you should NOT DRIVE or use heavy machinery until tomorrow (because of the sedation medicines used during the test).    FOLLOW UP: Our staff will call the number listed on your records the next business day following your procedure to check on you and address any questions or concerns that you may have regarding the information given to you following your procedure. If we do not reach you, we will leave a message.  However, if you are feeling well and you are not experiencing any problems, there is no need to return our call.  We will assume that you have returned to your regular daily activities without incident.  If any biopsies were taken you will be contacted by phone or by letter within the next 1-3 weeks.  Please call us at [phone] if you have not heard about the biopsies in 3 weeks.    SIGNATURES/CONFIDENTIALITY: You and/or your care partner have signed paperwork which will be entered into your electronic medical record.  These signatures attest to the fact that that the information above on your After Visit Summary has been reviewed and is understood.   You may resume your current medications today. Await biopsy results. Please call if any questions or concerns.

## 2014-10-29 NOTE — Progress Notes (Signed)
Report to PACU, RN, vss, BBS= Clear.  

## 2014-10-29 NOTE — Op Note (Signed)
[place]  [address] [place], [address]   ENDOSCOPY PROCEDURE REPORT  PATIENT: Andrea, Lang  MR#: [id number] BIRTHDATE: December 15, 1990 , 24  yrs. old GENDER: female ENDOSCOPIST: Jerene Bears, MD PROCEDURE DATE:  10/29/2014 PROCEDURE:  EGD, screening and EGD w/ biopsy ASA CLASS:     Class II INDICATIONS:  personal history of polyposis coli (FAP testing negative) s/p total colectomy, for screening for UGI polyps. MEDICATIONS: Monitored anesthesia care and Propofol 200 mg IV TOPICAL ANESTHETIC: none  DESCRIPTION OF PROCEDURE: After the risks benefits and alternatives of the procedure were thoroughly explained, informed consent was obtained.  The LB [id number] endoscope was introduced through the mouth and advanced to the third portion of the duodenum , Without limitations.  The instrument was slowly withdrawn as the mucosa was fully examined.  ESOPHAGUS: The mucosa of the esophagus appeared normal.  Z-line regular at 40 cm.  STOMACH: A small erosion was found in the gastric antrum.  Cold forcep biopsies were taken at the erosion in addition to the gastric body, antrum and angularis to evaluate for h.  pylori. Stomach otherwise normal, no polyps seen.  DUODENUM: The duodenal mucosa showed no abnormalities in the bulb and 2nd part of the duodenum and 3rd part duodenum.  No evidence of duodenal polyps.  Retroflexed views revealed no abnormalities.     The scope was then withdrawn from the patient and the procedure completed.  COMPLICATIONS: There were no immediate complications.  ENDOSCOPIC IMPRESSION: 1.   The mucosa of the esophagus appeared normal 2.   Small erosion was found in the gastric antrum; multiple biopsies 3.   The duodenal mucosa showed no abnormalities in the bulb and 2nd part of the duodenum and 3rd part duodenum 4.   No evidence of polyps in the examined upper GI tract  RECOMMENDATIONS: 1.  Await biopsy results 2.  Annual flexible  sigmoidoscopy for surveillance of rectal stump given history of multiple adenomatous colon polyps s/p total colectomy  eSigned:  Jerene Bears, MD 10/29/2014 10:51 AM  TD:SKAJG, Kieth Brightly MD and The Patient , [name], MD

## 2014-10-30 ENCOUNTER — Telehealth: Payer: Self-pay | Admitting: *Deleted

## 2014-10-30 NOTE — Telephone Encounter (Signed)
  Follow up Call-  Call back number 10/29/2014 04/10/2014  Post procedure Call Back phone  # [phone]  Permission to leave phone message Yes Yes     Patient questions:  Do you have a fever, pain , or abdominal swelling? No. Pain Score  0 *  Have you tolerated food without any problems? Yes.    Have you been able to return to your normal activities? Yes.    Do you have any questions about your discharge instructions: Diet   No. Medications  No. Follow up visit  No.  Do you have questions or concerns about your Care? No.  Actions: * If pain score is 4 or above: No action needed, pain <4.

## 2014-11-01 ENCOUNTER — Other Ambulatory Visit: Payer: Self-pay | Admitting: *Deleted

## 2014-11-01 ENCOUNTER — Encounter: Payer: Self-pay | Admitting: Internal Medicine

## 2014-11-01 DIAGNOSIS — A048 Other specified bacterial intestinal infections: Secondary | ICD-10-CM

## 2014-11-01 MED ORDER — BIS SUBCIT-METRONID-TETRACYC 140-125-125 MG PO CAPS: 3.0000 | ORAL_CAPSULE | Freq: Three times a day (TID) | ORAL | Status: DC

## 2014-11-01 MED ORDER — OMEPRAZOLE 40 MG PO CPDR: DELAYED_RELEASE_CAPSULE | ORAL | Status: DC

## 2014-11-07 ENCOUNTER — Telehealth: Payer: Self-pay | Admitting: Internal Medicine

## 2014-11-07 NOTE — Telephone Encounter (Signed)
Patient advised of information below. She verbalizes understanding. 

## 2014-11-07 NOTE — Telephone Encounter (Signed)
Left message for patient to call back. Pylera is for only 10 days. She was told to have repeat H Pylori testing in 2 months.

## 2014-12-18 ENCOUNTER — Encounter (HOSPITAL_COMMUNITY): Payer: Self-pay

## 2014-12-18 ENCOUNTER — Emergency Department (HOSPITAL_COMMUNITY)
Admission: EM | Admit: 2014-12-18 | Discharge: 2014-12-18 | Disposition: A | Discharge status: Home / Self Care | Payer: BLUE CROSS/BLUE SHIELD | Attending: Emergency Medicine | Admitting: Emergency Medicine

## 2014-12-18 ENCOUNTER — Emergency Department (HOSPITAL_COMMUNITY): Payer: BLUE CROSS/BLUE SHIELD

## 2014-12-18 DIAGNOSIS — J069 Acute upper respiratory infection, unspecified: Secondary | ICD-10-CM | POA: Diagnosis not present

## 2014-12-18 DIAGNOSIS — Z91040 Latex allergy status: Secondary | ICD-10-CM | POA: Insufficient documentation

## 2014-12-18 DIAGNOSIS — Z792 Long term (current) use of antibiotics: Secondary | ICD-10-CM | POA: Insufficient documentation

## 2014-12-18 DIAGNOSIS — Z86018 Personal history of other benign neoplasm: Secondary | ICD-10-CM | POA: Insufficient documentation

## 2014-12-18 DIAGNOSIS — K219 Gastro-esophageal reflux disease without esophagitis: Secondary | ICD-10-CM | POA: Diagnosis not present

## 2014-12-18 DIAGNOSIS — J9801 Acute bronchospasm: Secondary | ICD-10-CM

## 2014-12-18 DIAGNOSIS — J45901 Unspecified asthma with (acute) exacerbation: Secondary | ICD-10-CM | POA: Diagnosis not present

## 2014-12-18 DIAGNOSIS — Z79899 Other long term (current) drug therapy: Secondary | ICD-10-CM | POA: Insufficient documentation

## 2014-12-18 DIAGNOSIS — R0981 Nasal congestion: Secondary | ICD-10-CM | POA: Diagnosis present

## 2014-12-18 MED ORDER — IPRATROPIUM-ALBUTEROL 0.5-2.5 (3) MG/3ML IN SOLN
3.0000 mL | Freq: Once | RESPIRATORY_TRACT | Status: AC
  Administered 2014-12-18: 3 mL via RESPIRATORY_TRACT
  Filled 2014-12-18: qty 3

## 2014-12-18 MED ORDER — PREDNISONE 10 MG PO TABS: 20.0000 mg | ORAL_TABLET | Freq: Every day | ORAL | Status: DC

## 2014-12-18 MED ORDER — PREDNISONE 20 MG PO TABS
60.0000 mg | ORAL_TABLET | Freq: Once | ORAL | Status: AC
  Administered 2014-12-18: 60 mg via ORAL
  Filled 2014-12-18: qty 3

## 2014-12-18 NOTE — ED Provider Notes (Signed)
CSN: [id number]     Arrival date & time 12/18/14  1941 History  By signing my name below, I, Soijett Blue, attest that this documentation has been prepared under the direction and in the presence of Junius Creamer, NP Electronically Signed: Soijett Blue, ED Scribe. 12/18/2014. 8:26 PM.   Chief Complaint  Patient presents with  . Nasal Congestion      The history is provided by the patient. No language interpreter was used.    Andrea Lang is a 24 y.o. female who presents to the Emergency Department complaining of nasal congestion onset 1 week. She notes that she has an inhaler but it doesn't work for her symptoms. she reports that she has not been dx with asthma officially but she notes that she used her inhaler four times today. She states that she is having associated symptoms of cough and wheezing. She states that she has tried OTC medications with no relief for her symptoms. She denies fever, chills, color change, rash, wound, and any other symptoms.   Past Medical History  Diagnosis Date  . Seasonal allergies   . Sinusitis   . Asthma     no real dx; but requires use of an inhaler for occassional SOB, more with pregnancy  . 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 GERD (gastroesophageal reflux disease)     OCCASIONAL    Past Surgical History  Procedure Laterality Date  . Cesarean section  2009  . Tonsillectomy    . Laparoscopic partial colectomy N/A 06/20/2014    Procedure: LAPAROSCOPIC TOTAL ABDOMINAL  COLECTOMY;  Surgeon: [name], MD;  Location: [facility];  Service: General;  Laterality: N/A;   Family History  Problem Relation Age of Onset  . Colon cancer Neg Hx   . Esophageal cancer Neg Hx   . Rectal cancer Neg Hx   . Stomach cancer Neg Hx    Social History  Substance Use Topics  . Smoking status: Never Smoker   . Smokeless tobacco: Never Used  . Alcohol Use: No   OB History    Gravida Para Term Preterm AB  TAB SAB Ectopic Multiple Living   4 2 2  2  2   2      Review of Systems  Constitutional: Negative for fever and chills.  HENT: Positive for congestion.   Respiratory: Positive for cough and wheezing.   Neurological: Negative for dizziness and headaches.    Allergies  Avocado; Banana; Food; Kiwi extract; Mangifera indica; and Latex  Home Medications   Prior to Admission medications   Medication Sig Start Date End Date Taking? Authorizing Provider  bismuth-metronidazole-tetracycline [facility]) [phone] MG capsule Take 3 capsules by mouth 4 (four) times daily -  before meals and at bedtime. 11/01/14   Jerene Bears, MD  levonorgestrel (MIRENA) 20 MCG/24HR IUD 1 each by Intrauterine route once. Frequency:   Dosage:20   MCG/24HR  Instructions:Mirena (20MCG/24HR IUD , Intrauterine )  Note:inserted at GYN 04//2014 at 6 weeks post partum check.    Historical Provider, MD  montelukast (SINGULAIR) 10 MG tablet Take 1 tablet by mouth daily as needed (allergies).  05/22/14   Historical Provider, MD  omeprazole (PRILOSEC) 40 MG capsule Take one po BID x 10 days 11/01/14   Jerene Bears, MD  predniSONE (DELTASONE) 10 MG tablet Take 2 tablets (20 mg total) by mouth daily. 12/18/14   Junius Creamer, NP  [employer]  HFA 108 (90 BASE) MCG/ACT inhaler Inhale 2 puffs into the lungs every 4 (four) hours as needed for wheezing or shortness of breath.  05/01/14   Historical Provider, MD  pseudoephedrine (SUDAFED) 60 MG tablet Take 60 mg by mouth every 4 (four) hours as needed for congestion.    Historical Provider, MD   BP 135/76 mmHg  Pulse 69  Temp(Src) 97.5 F (36.4 C) (Oral)  Resp 20  SpO2 100%  LMP 11/09/2014 Physical Exam  Constitutional: She is oriented to person, place, and time. She appears well-developed and well-nourished. No distress.  HENT:  Head: Normocephalic and atraumatic.  Nasal congestion noted  Eyes: EOM are normal. Pupils are equal, round, and reactive to light.  Neck: Neck supple.   Cardiovascular: Normal rate, regular rhythm and normal heart sounds.  Exam reveals no gallop and no friction rub.   No murmur heard. Pulmonary/Chest: Effort normal. No respiratory distress. She has wheezes. She has no rales. She exhibits no tenderness.  Wheezing worse on the left than the right   Musculoskeletal: Normal range of motion.  Neurological: She is alert and oriented to person, place, and time.  Skin: Skin is warm and dry.  Psychiatric: She has a normal mood and affect. Her behavior is normal.  Nursing note and vitals reviewed.   ED Course  Procedures (including critical care time) DIAGNOSTIC STUDIES: Oxygen Saturation is 100% on RA, nl by my interpretation.    COORDINATION OF CARE: 8:22 PM Discussed treatment plan with pt at bedside which includes CXR, breathing treatment, and prednisone and pt agreed to plan.    Labs Review Labs Reviewed - No data to display  Imaging Review Dg Chest 2 View  12/18/2014  CLINICAL DATA:  Mid chest pain and shortness of breath today. Hurts with deep breathing. Wheezing. EXAM: CHEST  2 VIEW COMPARISON:  None. FINDINGS: The heart size and mediastinal contours are within normal limits. Both lungs are clear. The visualized skeletal structures are unremarkable. IMPRESSION: No active cardiopulmonary disease. Electronically Signed   By: Lucienne Capers M.D.   On: 12/18/2014 20:56   I have personally reviewed and evaluated these images as part of my medical decision-making.   EKG Interpretation None     Xray normal will be DC home with Rx for Prednisone and regular use of her inhaler  MDM   Final diagnoses:  Bronchospasm  URI (upper respiratory infection)   I personally performed the services described in this documentation, which was scribed in my presence. The recorded information has been reviewed and is accurate.   Junius Creamer, NP 12/18/14 2114  Virgel Manifold, MD 12/29/14 [id number]

## 2014-12-18 NOTE — Discharge Instructions (Signed)
Bronchospasm, Adult A bronchospasm is when the tubes that carry air in and out of your lungs (airways) spasm or tighten. During a bronchospasm it is hard to breathe. This is because the airways get smaller. A bronchospasm can be triggered by:  Allergies. These may be to animals, pollen, food, or mold.  Infection. This is a common cause of bronchospasm.  Exercise.  Irritants. These include pollution, cigarette smoke, strong odors, aerosol sprays, and paint fumes.  Weather changes.  Stress.  Being emotional. HOME CARE   Always have a plan for getting help. Know when to call your doctor and local emergency services (911 in the [place] Know where you can get emergency care.  Only take medicines as told by your doctor.  If you were prescribed an inhaler or nebulizer machine, ask your doctor how to use it correctly. Always use a spacer with your inhaler if you were given one.  Stay calm during an attack. Try to relax and breathe more slowly.  Control your home environment:  Change your heating and air conditioning filter at least once a month.  Limit your use of fireplaces and wood stoves.  Do not  smoke. Do not  allow smoking in your home.  Avoid perfumes and fragrances.  Get rid of pests (such as roaches and mice) and their droppings.  Throw away plants if you see mold on them.  Keep your house clean and dust free.  Replace carpet with wood, tile, or vinyl flooring. Carpet can trap dander and dust.  Use allergy-proof pillows, mattress covers, and box spring covers.  Wash bed sheets and blankets every week in hot water. Dry them in a dryer.  Use blankets that are made of polyester or cotton.  Wash hands frequently. GET HELP IF:  You have muscle aches.  You have chest pain.  The thick spit you spit or cough up (sputum) changes from clear or white to yellow, green, gray, or bloody.  The thick spit you spit or cough up gets thicker.  There are problems that may be  related to the medicine you are given such as:  A rash.  Itching.  Swelling.  Trouble breathing. GET HELP RIGHT AWAY IF:  You feel you cannot breathe or catch your breath.  You cannot stop coughing.  Your treatment is not helping you breathe better.  You have very bad chest pain. MAKE SURE YOU:   Understand these instructions.  Will watch your condition.  Will get help right away if you are not doing well or get worse.   This information is not intended to replace advice given to you by your health care provider. Make sure you discuss any questions you have with your health care provider.   Document Released: 11/09/2008 Document Revised: 02/02/2014 Document Reviewed: 07/05/2012 Elsevier Interactive Patient Education 2016 Elsevier Inc.  

## 2014-12-18 NOTE — ED Notes (Signed)
Pt complains of congestion and a cough for one week and then she started wheezing and feeling short of breath this am

## 2014-12-28 ENCOUNTER — Telehealth: Payer: Self-pay

## 2014-12-28 NOTE — Telephone Encounter (Signed)
-----   Message from Hulan Saas, RN sent at 11/01/2014  3:22 PM EDT ----- Call and remind patient due for H. Pylori stool recheck for JMP. Lab in [facility].

## 2014-12-28 NOTE — Telephone Encounter (Signed)
Pt aware and states she is not taking prilosec now.

## 2015-01-02 ENCOUNTER — Other Ambulatory Visit: Payer: BLUE CROSS/BLUE SHIELD

## 2015-01-02 DIAGNOSIS — A048 Other specified bacterial intestinal infections: Secondary | ICD-10-CM

## 2015-01-04 LAB — H. PYLORI ANTIGEN, STOOL: H pylori Ag, Stl: NEGATIVE

## 2015-03-28 ENCOUNTER — Encounter: Payer: Self-pay | Admitting: Internal Medicine

## 2015-04-01 ENCOUNTER — Encounter: Payer: Self-pay | Admitting: Internal Medicine

## 2015-05-13 ENCOUNTER — Ambulatory Visit (AMBULATORY_SURGERY_CENTER): Payer: Self-pay

## 2015-05-13 VITALS — Ht 66.5 in | Wt 196.8 lb

## 2015-05-13 DIAGNOSIS — Z860100 Personal history of colon polyps, unspecified: Secondary | ICD-10-CM

## 2015-05-13 MED ORDER — SUPREP BOWEL PREP KIT 17.5-3.13-1.6 GM/177ML PO SOLN: 1.0000 | Freq: Once | ORAL | Status: DC

## 2015-05-13 NOTE — Progress Notes (Signed)
No allergies to eggs or soy No past problems with anesthesia No home oxygen No diet meds  Has email and internet; refused emmi 

## 2015-05-27 ENCOUNTER — Encounter: Payer: Self-pay | Admitting: Internal Medicine

## 2015-05-27 ENCOUNTER — Ambulatory Visit (AMBULATORY_SURGERY_CENTER): Payer: BLUE CROSS/BLUE SHIELD | Admitting: Internal Medicine

## 2015-05-27 VITALS — BP 98/60 | HR 67 | Temp 99.6°F | Resp 11 | Ht 66.0 in | Wt 196.0 lb

## 2015-05-27 DIAGNOSIS — Z86010 Personal history of colonic polyps: Secondary | ICD-10-CM | POA: Diagnosis not present

## 2015-05-27 DIAGNOSIS — K9189 Other postprocedural complications and disorders of digestive system: Secondary | ICD-10-CM | POA: Diagnosis not present

## 2015-05-27 DIAGNOSIS — K6389 Other specified diseases of intestine: Secondary | ICD-10-CM | POA: Diagnosis not present

## 2015-05-27 DIAGNOSIS — K529 Noninfective gastroenteritis and colitis, unspecified: Secondary | ICD-10-CM | POA: Diagnosis not present

## 2015-05-27 MED ORDER — SODIUM CHLORIDE 0.9 % IV SOLN: 500.0000 mL | INTRAVENOUS | Status: DC

## 2015-05-27 NOTE — Op Note (Signed)
[place] Patient Name: Andrea Lang Procedure Date: 05/27/2015 10:12 AM MRN: [id number] Endoscopist: Jerene Bears , MD Age: 25 Date of Birth: [birth year] Gender: Female Procedure:                Flexible Sigmoidoscopy Indications:              Surveillance: History of numerous adenomas (several                            with high grade dysplasia) throughout the colon,                            s/p subtotal colectomy with ileorectal anastomosis                            in May 2016 Medicines:                Monitored Anesthesia Care Procedure:                Pre-Anesthesia Assessment:                           - Prior to the procedure, a History and Physical                            was performed, and patient medications and                            allergies were reviewed. The patient's tolerance of                            previous anesthesia was also reviewed. The risks                            and benefits of the procedure and the sedation                            options and risks were discussed with the patient.                            All questions were answered, and informed consent                            was obtained. Prior Anticoagulants: The patient has                            taken no previous anticoagulant or antiplatelet                            agents. ASA Grade Assessment: II - A patient with                            mild systemic disease. After reviewing the risks  and benefits, the patient was deemed in                            satisfactory condition to undergo the procedure.                           After obtaining informed consent, the scope was                            passed under direct vision. The Model PCF-H190DL                            [id number]) scope was introduced through the anus                            and advanced to the the ileo-rectal anastomosis.                            The  flexible sigmoidoscopy was accomplished without                            difficulty. The patient tolerated the procedure                            well. The quality of the bowel preparation was                            excellent. Scope In: Scope Out: Findings:                 Normal examined neo-terminal ileum (examined for                            15-20 cm)                           End to side ileorectal anastomosis with 10 cm blind                            ileal limb. Within the ileal limb there were                            multiple polyps (3-5 mm) with the appearance of                            inflammatory polyps. Multiple polyps were sampled                            with cold forceps for histology and to exclude                            dysplasia.                           There was evidence of a prior end-to-side  ileo-colonic anastomosis in the recto-sigmoid                            colon. This was patent and was characterized by                            polypoid tissues most consistent with                            granulation/inflammatory polyps. These were sampled                            by biopsy with a cold forceps for histology.                           The rectum and recto-sigmoid colon appeared normal.                           The retroflexed view of the distal rectum and anal                            verge was normal and showed no anal or rectal                            abnormalities. Complications:            No immediate complications. Estimated Blood Loss:     Estimated blood loss was minimal. Impression:               - Normal examined neo-terminal ileum (examined for                            15-20 cm)                           - Small polyps in the blind ileal limb. Biopsied.                           - Patent end-to-side ileo-colonic anastomosis,                            characterized by inflammation  and polypoid tissue.                            Biopsied.                           - The rectum and recto-sigmoid colon are normal. Recommendation:           - Patient has a contact number available for                            emergencies. The signs and symptoms of potential                            delayed complications were discussed with the  patient. Return to normal activities tomorrow.                            Written discharge instructions were provided to the                            patient.                           - Resume previous diet.                           - Await pathology results.                           - Repeat flexible sigmoidoscopy in 1 year for                            surveillance. Jerene Bears, MD 05/27/2015 10:51:30 AM This report has been signed electronically.

## 2015-05-27 NOTE — Progress Notes (Signed)
A/ox3, pleased with MAC, report to RN 

## 2015-05-27 NOTE — Patient Instructions (Signed)
YOU HAD AN ENDOSCOPIC PROCEDURE TODAY AT [facility]:   Refer to the procedure report that was given to you for any specific questions about what was found during the examination.  If the procedure report does not answer your questions, please call your gastroenterologist to clarify.  If you requested that your care partner not be given the details of your procedure findings, then the procedure report has been included in a sealed envelope for you to review at your convenience later.  YOU SHOULD EXPECT: Some feelings of bloating in the abdomen. Passage of more gas than usual.  Walking can help get rid of the air that was put into your GI tract during the procedure and reduce the bloating. If you had a lower endoscopy (such as a colonoscopy or flexible sigmoidoscopy) you may notice spotting of blood in your stool or on the toilet paper. If you underwent a bowel prep for your procedure, you may not have a normal bowel movement for a few days.  Please Note:  You might notice some irritation and congestion in your nose or some drainage.  This is from the oxygen used during your procedure.  There is no need for concern and it should clear up in a day or so.  SYMPTOMS TO REPORT IMMEDIATELY:   Following lower endoscopy (colonoscopy or flexible sigmoidoscopy):  Excessive amounts of blood in the stool  Significant tenderness or worsening of abdominal pains  Swelling of the abdomen that is new, acute  Fever of 100F or higher  For urgent or emergent issues, a gastroenterologist can be reached at any hour by calling [phone].   DIET: Your first meal following the procedure should be a small meal and then it is ok to progress to your normal diet. Heavy or fried foods are harder to digest and may make you feel nauseous or bloated.  Likewise, meals heavy in dairy and vegetables can increase bloating.  Drink plenty of fluids but you should avoid alcoholic beverages for 24  hours.  ACTIVITY:  You should plan to take it easy for the rest of today and you should NOT DRIVE or use heavy machinery until tomorrow (because of the sedation medicines used during the test).    FOLLOW UP: Our staff will call the number listed on your records the next business day following your procedure to check on you and address any questions or concerns that you may have regarding the information given to you following your procedure. If we do not reach you, we will leave a message.  However, if you are feeling well and you are not experiencing any problems, there is no need to return our call.  We will assume that you have returned to your regular daily activities without incident.  If any biopsies were taken you will be contacted by phone or by letter within the next 1-3 weeks.  Please call us at [phone] if you have not heard about the biopsies in 3 weeks.    SIGNATURES/CONFIDENTIALITY: You and/or your care partner have signed paperwork which will be entered into your electronic medical record.  These signatures attest to the fact that that the information above on your After Visit Summary has been reviewed and is understood.  Full responsibility of the confidentiality of this discharge information lies with you and/or your care-partner.  Repeat flex/sig in 1 years with flex prep.  Wait biopsy results.

## 2015-05-28 ENCOUNTER — Telehealth: Payer: Self-pay

## 2015-05-28 NOTE — Telephone Encounter (Signed)
  Follow up Call-  Call back number 05/27/2015 10/29/2014 04/10/2014  Post procedure Call Back phone  # [phone]  Permission to leave phone message Yes Yes Yes    Patient was called for follow up after her procedure on 05/27/2015. No answer at the number given for follow up phone call. A message was left on the answering machine.

## 2015-06-03 ENCOUNTER — Encounter: Payer: Self-pay | Admitting: Internal Medicine

## 2016-04-23 ENCOUNTER — Encounter: Payer: Self-pay | Admitting: Internal Medicine

## 2016-05-14 ENCOUNTER — Encounter: Payer: Self-pay | Admitting: Internal Medicine

## 2016-06-25 ENCOUNTER — Ambulatory Visit (AMBULATORY_SURGERY_CENTER): Payer: Self-pay | Admitting: *Deleted

## 2016-06-25 VITALS — Ht 66.5 in | Wt 182.8 lb

## 2016-06-25 DIAGNOSIS — Z86010 Personal history of colonic polyps: Secondary | ICD-10-CM

## 2016-06-25 NOTE — Progress Notes (Signed)
No egg or soy allergy known to patient  No issues with past sedation with any surgeries  or procedures, no intubation problems  No diet pills per patient No home 02 use per patient  No blood thinners per patient  Pt denies issues with constipation  No A fib or A flutter  EMMI video declined- pt states she has already watched this  Instructed pt on how to use an enema in PV today as she states she has never done one before- instructed to call with further questions

## 2016-07-02 ENCOUNTER — Encounter: Payer: Self-pay | Admitting: Internal Medicine

## 2016-07-08 ENCOUNTER — Telehealth: Payer: Self-pay | Admitting: Internal Medicine

## 2016-07-08 NOTE — Telephone Encounter (Signed)
Returned patient's call and she wants to be sure how to use the Fleets enema and if she purchased the correct thing.  She wanted to know if she has to fill it up with water after using and I advised her no only the one time fleet.  She also asked about lunch if she can have solid foods.  I advised her that she can and only clear liquids for dinner until 3 hours prior to her procedure tomorrow.  All questions/concerns were addressed.

## 2016-07-09 ENCOUNTER — Ambulatory Visit (AMBULATORY_SURGERY_CENTER): Payer: BLUE CROSS/BLUE SHIELD | Admitting: Internal Medicine

## 2016-07-09 ENCOUNTER — Encounter: Payer: Self-pay | Admitting: Internal Medicine

## 2016-07-09 VITALS — BP 97/64 | HR 52 | Temp 98.7°F | Resp 16 | Ht 66.0 in | Wt 182.0 lb

## 2016-07-09 DIAGNOSIS — Z86010 Personal history of colonic polyps: Secondary | ICD-10-CM

## 2016-07-09 MED ORDER — SODIUM CHLORIDE 0.9 % IV SOLN: 500.0000 mL | INTRAVENOUS | Status: DC

## 2016-07-09 NOTE — Patient Instructions (Signed)
Discharge instructions given. Normal exam. Resume previous medications. YOU HAD AN ENDOSCOPIC PROCEDURE TODAY AT THE [facility]:   Refer to the procedure report that was given to you for any specific questions about what was found during the examination.  If the procedure report does not answer your questions, please call your gastroenterologist to clarify.  If you requested that your care partner not be given the details of your procedure findings, then the procedure report has been included in a sealed envelope for you to review at your convenience later.  YOU SHOULD EXPECT: Some feelings of bloating in the abdomen. Passage of more gas than usual.  Walking can help get rid of the air that was put into your GI tract during the procedure and reduce the bloating. If you had a lower endoscopy (such as a colonoscopy or flexible sigmoidoscopy) you may notice spotting of blood in your stool or on the toilet paper. If you underwent a bowel prep for your procedure, you may not have a normal bowel movement for a few days.  Please Note:  You might notice some irritation and congestion in your nose or some drainage.  This is from the oxygen used during your procedure.  There is no need for concern and it should clear up in a day or so.  SYMPTOMS TO REPORT IMMEDIATELY:   Following lower endoscopy (colonoscopy or flexible sigmoidoscopy):  Excessive amounts of blood in the stool  Significant tenderness or worsening of abdominal pains  Swelling of the abdomen that is new, acute  Fever of 100F or higher   For urgent or emergent issues, a gastroenterologist can be reached at any hour by calling [phone].   DIET:  We do recommend a small meal at first, but then you may proceed to your regular diet.  Drink plenty of fluids but you should avoid alcoholic beverages for 24 hours.  ACTIVITY:  You should plan to take it easy for the rest of today and you should NOT DRIVE or use heavy machinery  until tomorrow (because of the sedation medicines used during the test).    FOLLOW UP: Our staff will call the number listed on your records the next business day following your procedure to check on you and address any questions or concerns that you may have regarding the information given to you following your procedure. If we do not reach you, we will leave a message.  However, if you are feeling well and you are not experiencing any problems, there is no need to return our call.  We will assume that you have returned to your regular daily activities without incident.  If any biopsies were taken you will be contacted by phone or by letter within the next 1-3 weeks.  Please call us at [phone] if you have not heard about the biopsies in 3 weeks.    SIGNATURES/CONFIDENTIALITY: You and/or your care partner have signed paperwork which will be entered into your electronic medical record.  These signatures attest to the fact that that the information above on your After Visit Summary has been reviewed and is understood.  Full responsibility of the confidentiality of this discharge information lies with you and/or your care-partner. 

## 2016-07-09 NOTE — Progress Notes (Signed)
Pt's states no medical or surgical changes since previsit or office visit. 

## 2016-07-09 NOTE — Progress Notes (Signed)
Alert and oriented x3, pleased with MAC, report to [employer]

## 2016-07-09 NOTE — Op Note (Signed)
[facility] Patient Name: Andrea Lang Procedure Date: 07/09/2016 10:08 AM MRN: [id number] Endoscopist: Jerene Bears , MD Age: 26 Referring MD:  Date of Birth: [birth year] Gender: Female Account #: [id number] Procedure:                Flexible Sigmoidoscopy Indications:              High risk colon cancer surveillance: Personal                            history of multiple advanced adenomas throughout                            the colon status post subtotal colectomy with                            ileorectal anastomosis in 2016 Medicines:                Monitored Anesthesia Care Procedure:                Pre-Anesthesia Assessment:                           - Prior to the procedure, a History and Physical                            was performed, and patient medications and                            allergies were reviewed. The patient's tolerance of                            previous anesthesia was also reviewed. The risks                            and benefits of the procedure and the sedation                            options and risks were discussed with the patient.                            All questions were answered, and informed consent                            was obtained. Prior Anticoagulants: The patient has                            taken no previous anticoagulant or antiplatelet                            agents. ASA Grade Assessment: II - A patient with                            mild systemic disease. After reviewing the risks  and benefits, the patient was deemed in                            satisfactory condition to undergo the procedure.                           After obtaining informed consent, the scope was                            passed under direct vision. The Colonoscope was                            introduced through the anus and advanced to the the                            ileo-rectal anastomosis. The  flexible sigmoidoscopy                            was accomplished without difficulty. The patient                            tolerated the procedure well. The quality of the                            bowel preparation was good. Scope In: Scope Out: Findings:                 The perianal and digital rectal examinations were                            normal.                           Scattered mild inflammation characterized by                            erosions and erythema was found in the distal                            ileum, more evident in the blind limb. The                            remaining ileum appears normal.                           There was evidence of a prior end-to-side                            ileo-rectal anastomosis in the recto-sigmoid colon.                            This was patent and was characterized by mild                            inflammation/granulation change (previously  biopsied one year ago and inflammation appears                            milder than before) and an intact staple line.                           The rectum (entire remaining colon/rectum )appeared                            normal. No polyps.                           The retroflexed view of the distal rectum and anal                            verge was normal and showed no anal or rectal                            abnormalities. Complications:            No immediate complications. Estimated Blood Loss:     Estimated blood loss: none. Impression:               - Scattered mild inflammation in the ileum just                            before ileorectal anastomosis.                           - Patent end-to-side ileo-rectal anastomosis,                            characterized by mild inflammation and an intact                            staple line.                           - The rectum is normal.                           - No polyps.                            - No specimens collected. Recommendation:           - Patient has a contact number available for                            emergencies. The signs and symptoms of potential                            delayed complications were discussed with the                            patient. Return to normal activities tomorrow.  Written discharge instructions were provided to the                            patient.                           - Resume previous diet.                           - Repeat flexible sigmoidoscopy in 1 year for                            surveillance. Jerene Bears, MD 07/09/2016 10:30:46 AM This report has been signed electronically.

## 2016-07-10 ENCOUNTER — Telehealth: Payer: Self-pay | Admitting: *Deleted

## 2016-07-10 ENCOUNTER — Telehealth: Payer: Self-pay

## 2016-07-10 NOTE — Telephone Encounter (Signed)
  Left message for pt to return call if any issues/problems/questions/concerns.

## 2016-07-10 NOTE — Telephone Encounter (Signed)
Left message on f/u call 

## 2017-06-12 ENCOUNTER — Encounter: Payer: Self-pay | Admitting: Internal Medicine

## 2017-06-30 ENCOUNTER — Encounter: Payer: Self-pay | Admitting: Internal Medicine

## 2017-09-08 ENCOUNTER — Ambulatory Visit (AMBULATORY_SURGERY_CENTER): Payer: Self-pay | Admitting: *Deleted

## 2017-09-08 VITALS — Ht 66.0 in | Wt 190.2 lb

## 2017-09-08 DIAGNOSIS — Z86010 Personal history of colonic polyps: Secondary | ICD-10-CM

## 2017-09-08 NOTE — Progress Notes (Signed)
No egg or soy allergy known to patient  No issues with past sedation with any surgeries  or procedures, no intubation problems  No diet pills per patient No home 02 use per patient  No blood thinners per patient  Pt denies issues with constipation  No A fib or A flutter  EMMI video offered, patient declined

## 2017-09-20 ENCOUNTER — Encounter: Payer: Self-pay | Admitting: Internal Medicine

## 2017-09-20 ENCOUNTER — Ambulatory Visit (AMBULATORY_SURGERY_CENTER): Payer: Medicaid Other | Admitting: Internal Medicine

## 2017-09-20 VITALS — BP 101/59 | HR 57 | Temp 98.9°F | Resp 13 | Ht 66.0 in | Wt 190.0 lb

## 2017-09-20 DIAGNOSIS — Z86010 Personal history of colonic polyps: Secondary | ICD-10-CM

## 2017-09-20 DIAGNOSIS — K6389 Other specified diseases of intestine: Secondary | ICD-10-CM | POA: Diagnosis not present

## 2017-09-20 DIAGNOSIS — K621 Rectal polyp: Secondary | ICD-10-CM

## 2017-09-20 DIAGNOSIS — D128 Benign neoplasm of rectum: Secondary | ICD-10-CM

## 2017-09-20 DIAGNOSIS — K9189 Other postprocedural complications and disorders of digestive system: Secondary | ICD-10-CM

## 2017-09-20 MED ORDER — SODIUM CHLORIDE 0.9 % IV SOLN: 500.0000 mL | Freq: Once | INTRAVENOUS | Status: DC

## 2017-09-20 NOTE — Progress Notes (Signed)
Pt's states no medical or surgical changes since previsit or office visit. 

## 2017-09-20 NOTE — Progress Notes (Signed)
Report given to PACU, vss 

## 2017-09-20 NOTE — Op Note (Signed)
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 The patient's tolerance of                            previous anesthesia was also reviewed. The risks                            and benefits of the procedure and the sedation                            options and risks were discussed with the patient.                            All questions were answered, and informed consent                            was obtained. Prior Anticoagulants: The patient has                            taken no previous anticoagulant or antiplatelet                            agents. ASA Grade Assessment: II - A patient with                            mild systemic disease. After reviewing the risks                            and benefits, the patient was deemed in  satisfactory condition to undergo the procedure.                           After obtaining informed consent, the scope was                            passed under direct vision. The Colonoscope was                            introduced through the anus and advanced to the                            ileo-rectal anastomosis. The flexible sigmoidoscopy                            was accomplished  without difficulty. The patient                            tolerated the procedure well. The quality of the                            bowel preparation was good. Scope In: 9:22:10 AM Scope Out: 9:28:03 AM Scope Withdrawal Time: 0 hours 4 minutes 8 seconds  Total Procedure Duration: 0 hours 5 minutes 53 seconds  Findings:                 The digital rectal exam was normal.                           There was evidence of a prior end-to-side                            ileo-colonic anastomosis in the recto-sigmoid                            colon. This was patent and was characterized by                            erythema/inflammation with granulation type tissue.                            The anastomosis was traversed. An area of polypoid                            granulation-appearing tissue was biopsied with a                            cold forceps for histology (to exclude adenomatous                            change). The remaining examined ileum was normal.                           A 4 mm polyp was found in the rectum. The polyp was  sessile. The polyp was removed with a cold snare.                            Resection and retrieval were complete.                           The retroflexed view of the distal rectum and anal                            verge was normal and showed no anal or rectal                            abnormalities. Complications:            No immediate complications. Estimated Blood Loss:     Estimated blood loss was minimal. Impression:               - Patent end-to-side ileo-colonic anastomosis,                            characterized by inflammation. Biopsied.                           - One 4 mm polyp in the rectum, removed with a cold                            snare. Resected and retrieved. Recommendation:           - Patient has a contact number available for                            emergencies. The signs and symptoms of  potential                            delayed complications were discussed with the                            patient. Return to normal activities tomorrow.                            Written discharge instructions were provided to the                            patient.                           - Resume previous diet.                           - Await pathology results.                           - Repeat flexible sigmoidoscopy in 1 year for                            surveillance. EGD on same day. Jerene Bears, MD 09/20/2017  9:41:09 AM This report has been signed electronically.

## 2017-09-20 NOTE — Patient Instructions (Signed)
Please read handout on Polyps. Recommending repeat Sigmoidoscopy in one year, and with an EGD.     YOU HAD AN ENDOSCOPIC PROCEDURE TODAY AT [facility]:   Refer to the procedure report that was given to you for any specific questions about what was found during the examination.  If the procedure report does not answer your questions, please call your gastroenterologist to clarify.  If you requested that your care partner not be given the details of your procedure findings, then the procedure report has been included in a sealed envelope for you to review at your convenience later.  YOU SHOULD EXPECT: Some feelings of bloating in the abdomen. Passage of more gas than usual.  Walking can help get rid of the air that was put into your GI tract during the procedure and reduce the bloating. If you had a lower endoscopy (such as a colonoscopy or flexible sigmoidoscopy) you may notice spotting of blood in your stool or on the toilet paper. If you underwent a bowel prep for your procedure, you may not have a normal bowel movement for a few days.  Please Note:  You might notice some irritation and congestion in your nose or some drainage.  This is from the oxygen used during your procedure.  There is no need for concern and it should clear up in a day or so.  SYMPTOMS TO REPORT IMMEDIATELY:   Following lower endoscopy (colonoscopy or flexible sigmoidoscopy):  Excessive amounts of blood in the stool  Significant tenderness or worsening of abdominal pains  Swelling of the abdomen that is new, acute  Fever of 100F or higher   For urgent or emergent issues, a gastroenterologist can be reached at any hour by calling [phone].   DIET:  We do recommend a small meal at first, but then you may proceed to your regular diet.  Drink plenty of fluids but you should avoid alcoholic beverages for 24 hours.  ACTIVITY:  You should plan to take it easy for the rest of today and you should NOT  DRIVE or use heavy machinery until tomorrow (because of the sedation medicines used during the test).    FOLLOW UP: Our staff will call the number listed on your records the next business day following your procedure to check on you and address any questions or concerns that you may have regarding the information given to you following your procedure. If we do not reach you, we will leave a message.  However, if you are feeling well and you are not experiencing any problems, there is no need to return our call.  We will assume that you have returned to your regular daily activities without incident.  If any biopsies were taken you will be contacted by phone or by letter within the next 1-3 weeks.  Please call us at [phone] if you have not heard about the biopsies in 3 weeks.    SIGNATURES/CONFIDENTIALITY: You and/or your care partner have signed paperwork which will be entered into your electronic medical record.  These signatures attest to the fact that that the information above on your After Visit Summary has been reviewed and is understood.  Full responsibility of the confidentiality of this discharge information lies with you and/or your care-partner.

## 2017-09-20 NOTE — Progress Notes (Signed)
Called to room to assist during endoscopic procedure.  Patient ID and intended procedure confirmed with present staff. Received instructions for my participation in the procedure from the performing physician.  

## 2017-09-21 ENCOUNTER — Telehealth: Payer: Self-pay

## 2017-09-21 NOTE — Telephone Encounter (Signed)
  Follow up Call-  Call back number 09/20/2017 07/09/2016 05/27/2015  Post procedure Call Back phone  # [phone]  Permission to leave phone message Yes Yes Yes  Some recent data might be hidden     Patient questions:  Do you have a fever, pain , or abdominal swelling? No. Pain Score  0 *  Have you tolerated food without any problems? Yes.    Have you been able to return to your normal activities? Yes.    Do you have any questions about your discharge instructions: Diet   No. Medications  No. Follow up visit  No.  Do you have questions or concerns about your Care? No.  Actions: * If pain score is 4 or above: No action needed, pain <4.

## 2017-09-28 ENCOUNTER — Encounter: Payer: Self-pay | Admitting: Internal Medicine

## 2017-12-12 ENCOUNTER — Emergency Department (HOSPITAL_COMMUNITY)
Admission: EM | Admit: 2017-12-12 | Discharge: 2017-12-12 | Disposition: A | Discharge status: Home / Self Care | Payer: Medicaid Other | Attending: Emergency Medicine | Admitting: Emergency Medicine

## 2017-12-12 ENCOUNTER — Encounter (HOSPITAL_COMMUNITY): Payer: Self-pay

## 2017-12-12 ENCOUNTER — Emergency Department (HOSPITAL_COMMUNITY): Payer: Medicaid Other

## 2017-12-12 DIAGNOSIS — Z79899 Other long term (current) drug therapy: Secondary | ICD-10-CM | POA: Insufficient documentation

## 2017-12-12 DIAGNOSIS — Z91040 Latex allergy status: Secondary | ICD-10-CM | POA: Insufficient documentation

## 2017-12-12 DIAGNOSIS — J45909 Unspecified asthma, uncomplicated: Secondary | ICD-10-CM | POA: Insufficient documentation

## 2017-12-12 DIAGNOSIS — R091 Pleurisy: Secondary | ICD-10-CM | POA: Diagnosis not present

## 2017-12-12 DIAGNOSIS — M549 Dorsalgia, unspecified: Secondary | ICD-10-CM | POA: Diagnosis present

## 2017-12-12 LAB — I-STAT CHEM 8, ED
BUN: 15 mg/dL (ref 6–20)
Calcium, Ion: 1.15 mmol/L (ref 1.15–1.40)
Chloride: 101 mmol/L (ref 98–111)
Creatinine, Ser: 0.7 mg/dL (ref 0.44–1.00)
Glucose, Bld: 85 mg/dL (ref 70–99)
HCT: 40 % (ref 36.0–46.0)
Hemoglobin: 13.6 g/dL (ref 12.0–15.0)
Potassium: 3.7 mmol/L (ref 3.5–5.1)
Sodium: 140 mmol/L (ref 135–145)
TCO2: 28 mmol/L (ref 22–32)

## 2017-12-12 LAB — D-DIMER, QUANTITATIVE (NOT AT ARMC): D-Dimer, Quant: 0.31 ug/mL-FEU (ref 0.00–0.50)

## 2017-12-12 LAB — POC URINE PREG, ED: Preg Test, Ur: NEGATIVE

## 2017-12-12 MED ORDER — DEXAMETHASONE 4 MG PO TABS
10.0000 mg | ORAL_TABLET | Freq: Once | ORAL | Status: AC
  Administered 2017-12-12: 10 mg via ORAL
  Filled 2017-12-12: qty 3

## 2017-12-12 NOTE — ED Provider Notes (Signed)
[facility] EMERGENCY DEPARTMENT Provider Note   CSN: [id number] Arrival date & time: 12/12/17  1646     History   Chief Complaint Chief Complaint  Patient presents with  . Back Pain    HPI Andrea Lang is a 27 y.o. female.  The history is provided by the patient. No language interpreter was used.  Back Pain     Andrea Lang is a 27 y.o. female who presents to the Emergency Department complaining of back pain. Presents to the emergency department complaining of sharp left sided back pain that began yesterday. Pain is worse with taking a deep breath as well as movement and described as pleuritic in nature. It does spread slightly over to the right. She denies any fevers, cough, abdominal pain, hematuria, dysuria, hematuria, leg swelling or pain. No prior similar symptoms. She does have a history of asthma and uses an albuterol inhaler. No significant change in symptoms with the inhaler. Past Medical History:  Diagnosis Date  . Allergy   . Asthma    no real dx; but requires use of an inhaler for occassional SOB, more with pregnancy  . GERD (gastroesophageal reflux disease)    OCCASIONAL   . Seasonal allergies   . Sinusitis   . 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 History of colonic polyps 04/26/2014    Past Surgical History:  Procedure Laterality Date  . CESAREAN SECTION  2009  . COLONOSCOPY    . LAPAROSCOPIC PARTIAL COLECTOMY N/A 06/20/2014   Procedure: LAPAROSCOPIC TOTAL ABDOMINAL  COLECTOMY;  Surgeon: [name], MD;  Location: [facility];  Service: General;  Laterality: N/A;  . POLYPECTOMY    . TONSILLECTOMY       OB History    Gravida  4   Para  2   Term  2   Preterm      AB  2   Living  2     SAB  2   TAB      Ectopic      Multiple      Live Births  2            Home  Medications    Prior to Admission medications   Medication Sig Start Date End Date Taking? Authorizing Provider  cetirizine (ZYRTEC) 5 MG tablet Take 5 mg by mouth daily.   Yes [provider]  PROAIR HFA 108 (90 BASE) MCG/ACT inhaler Inhale 2 puffs into the lungs every 4 (four) hours as needed for wheezing or shortness of breath.  05/01/14  Yes [provider]    Family History Family History  Problem Relation Age of Onset  . Colon cancer Neg Hx   . Esophageal cancer Neg Hx   . Rectal cancer Neg Hx   . Stomach cancer Neg Hx   . Colon polyps Neg Hx     Social History Social History   Tobacco Use  . Smoking status: Never Smoker  . Smokeless tobacco: Never Used  Substance Use Topics  . Alcohol use: No    Alcohol/week: 0.0 standard drinks  . Drug use: No     Allergies   Other; Aspirin; Avocado; Banana; Food; Kiwi extract; Mangifera indica; Nsaids; and Latex   Review of Systems Review of Systems  Musculoskeletal: Positive for back pain.  All other systems reviewed and are negative.    Physical Exam Updated Vital Signs BP 138/82 (BP Location: Right Arm)   Pulse 69   Temp 97.7 F (36.5 C) (Oral)   Resp 17   LMP 11/21/2017 (Approximate)   SpO2 100%   Physical Exam  Constitutional: She is oriented to person, place, and time. She appears well-developed and well-nourished.  HENT:  Head: Normocephalic and atraumatic.  Cardiovascular: Normal rate and regular rhythm.  No murmur heard. Pulmonary/Chest: Effort normal and breath sounds normal. No respiratory distress. She exhibits no tenderness.  Abdominal: Soft. There is no tenderness. There is no rebound and no guarding.  No cva tenderness  Musculoskeletal: She exhibits no edema or tenderness.  Neurological: She is alert and oriented to person, place, and time.  Skin: Skin is warm and dry.  Psychiatric: She has a normal mood and affect. Her behavior is normal.  Nursing note and vitals  reviewed.    ED Treatments / Results  Labs (all labs ordered are listed, but only abnormal results are displayed) Labs Reviewed  D-DIMER, QUANTITATIVE (NOT AT [facility])  I-STAT CHEM 8, ED  POC URINE PREG, ED    EKG None  Radiology Dg Chest 2 View  Result Date: 12/12/2017 CLINICAL DATA:  Shortness of breath, left back pain EXAM: CHEST - 2 VIEW COMPARISON:  12/18/2014 FINDINGS: Heart and mediastinal contours are within normal limits. No focal opacities or effusions. No acute bony abnormality. IMPRESSION: No active cardiopulmonary disease. Electronically Signed   By: Rolm Baptise M.D.   On: 12/12/2017 17:31    Procedures Procedures (including critical care time)  Medications Ordered in ED Medications  dexamethasone (DECADRON) tablet 10 mg (has no administration in time range)     Initial Impression / Assessment and Plan / ED Course  I have reviewed the triage vital signs and the nursing notes.  Pertinent labs & imaging results that were available during my care of the patient were reviewed by me and considered in my medical decision making (see chart for details).     Patient here for evaluation of left sided pleuritic back pain. She is non-toxic appearing on examination with no respiratory distress. Lungs are clear bilaterally. No evidence of asthma exacerbation. D dimer is negative, presentation is not consistent with PE. No evidence of pneumothorax or pneumonia. Patient with likely pleurisy/musculoskeletal back pain. Discussed home care with Tylenol. She does have an NSAID allergy, will provide one-time dose of steroids. Plan to discharge home with outpatient follow-up and return precautions.  Final Clinical Impressions(s) / ED Diagnoses   Final diagnoses:  Pleurisy    ED Discharge Orders    None       Quintella Reichert, MD 12/12/17 [phone]

## 2017-12-12 NOTE — ED Triage Notes (Signed)
Onset yesterday mid back pain L>R  No known injuries, cough.  C/o shortness of breath when taking deep breath.   Talking in complete sentences.  NAD

## 2017-12-27 ENCOUNTER — Encounter: Payer: Self-pay | Admitting: Allergy

## 2017-12-27 ENCOUNTER — Ambulatory Visit (INDEPENDENT_AMBULATORY_CARE_PROVIDER_SITE_OTHER): Payer: Medicaid Other | Admitting: Allergy

## 2017-12-27 VITALS — BP 112/72 | HR 78 | Temp 97.8°F | Resp 18 | Ht 66.5 in | Wt 195.0 lb

## 2017-12-27 DIAGNOSIS — T781XXA Other adverse food reactions, not elsewhere classified, initial encounter: Secondary | ICD-10-CM | POA: Insufficient documentation

## 2017-12-27 DIAGNOSIS — J4520 Mild intermittent asthma, uncomplicated: Secondary | ICD-10-CM

## 2017-12-27 DIAGNOSIS — T781XXD Other adverse food reactions, not elsewhere classified, subsequent encounter: Secondary | ICD-10-CM

## 2017-12-27 DIAGNOSIS — J3089 Other allergic rhinitis: Secondary | ICD-10-CM | POA: Insufficient documentation

## 2017-12-27 MED ORDER — EPINEPHRINE 0.3 MG/0.3ML IJ SOAJ: 0.3000 mg | Freq: Once | INTRAMUSCULAR | 2 refills | Status: DC | PRN

## 2017-12-27 NOTE — Assessment & Plan Note (Signed)
Currently avoiding banana, kiwi, mango, avocado, melon as they cause throat closure and lip swelling.   Continue to avoid above items and will test at next visit.  I have prescribed epinephrine injectable and demonstrated proper use. For mild symptoms you can take over the counter antihistamines such as Benadryl and monitor symptoms closely. If symptoms worsen or if you have severe symptoms including breathing issues, throat closure, significant swelling, whole body hives, severe diarrhea and vomiting, lightheadedness then inject epinephrine and seek immediate medical care afterwards.

## 2017-12-27 NOTE — Progress Notes (Signed)
New Patient Note  RE: Andrea Lang MRN: [id number] DOB: 1990/03/23 Date of Office Visit: 12/27/2017  Referring provider: Berkley Harvey, NP Primary care provider: Berkley Harvey, NP  Chief Complaint: Immunotherapy (patient would like to start immunotherapy with our office. she was skin tested last year by ENT. )  History of Present Illness: I had the pleasure of seeing Andrea Lang for initial evaluation at the [facility] on 12/27/2017. She is a 27 y.o. female, who is referred here by Berkley Harvey, NP for the evaluation of restarting allergy injections.   Allergic rhinitis: She reports symptoms of nasal congestion, itchy eyes. Symptoms have been going on for 5 years. The symptoms are present all year around with worsening in summer. Other triggers include exposure to cats, horse. Anosmia: no. Headache: no. She has used zyrtec, allegra, Singulair, Flonase with minimal improvement in symptoms. Sinus infections: no. Previous work up includes: 2018 skin testing showed multiple positives per patient report - records not available for review during visit. Previous ENT evaluation: yes. Previous sinus imaging: no. Patient has been on allergy injections by ENT for 1 year at weekly injections. Large localized reactions at times but otherwise no issues.  Last injection was about a few months ago. Patient wants to transfer care to our office as Andrea Lang is closer to her home.   Assessment and Plan: Andrea Lang is a 27 y.o. female with: Other allergic rhinitis Perennial rhinitis symptoms for the past 5 years with worsening in the summer. Patient was followed by ENT in [facility] and has been on weekly allergy immunotherapy with some benefit. Last skin testing was in 2018 - records not available for review. Will obtain results. Tried Singulair and Flonase in the past with minimal benefit.   Unable to skin test today due to recent antihistamine intake. Will return for skin testing as patient  would like to resume injections at our office as it is closer to her home. No issues with allergy injections in the past except for some localized reactions.  May use over the counter antihistamines such as Zyrtec (cetirizine), Claritin (loratadine), Allegra (fexofenadine), or Xyzal (levocetirizine) daily as needed.  Will make additional recommendations based on results. Reviewed allergy immunotherapy information.   Adverse food reaction Currently avoiding banana, kiwi, mango, avocado, melon as they cause throat closure and lip swelling.   Continue to avoid above items and will test at next visit.  I have prescribed epinephrine injectable and demonstrated proper use. For mild symptoms you can take over the counter antihistamines such as Benadryl and monitor symptoms closely. If symptoms worsen or if you have severe symptoms including breathing issues, throat closure, significant swelling, whole body hives, severe diarrhea and vomiting, lightheadedness then inject epinephrine and seek immediate medical care afterwards.  Mild intermittent asthma without complication SOB and wheezing for the past 2-3 years for which albuterol helps. Using less than a few times per month.   Today's spirometry was normal.  May use albuterol rescue inhaler 2 puffs or nebulizer every 4 to 6 hours as needed for shortness of breath, chest tightness, coughing, and wheezing. May use albuterol rescue inhaler 2 puffs 5 to 15 minutes prior to strenuous physical activities.  Monitor symptoms.  Return in about 1 week (around 01/03/2018) for Skin testing.  Meds ordered this encounter  Medications  . EPINEPHrine (EPIPEN 2-PAK) 0.3 mg/0.3 mL IJ SOAJ injection    Sig: Inject 0.3 mLs (0.3 mg total) into the muscle once  as needed for up to 1 dose.    Dispense:  2 Device    Refill:  2    Dispense generic Mylan   Other allergy screening: Asthma: no  She reports symptoms of shortness of breath, wheezing for 2-3 years.  Current medications include albuterol prn which help. She reports not using aerochamber with inhalers. She tried the following inhalers: none. Main triggers are weather changes. In the last month, frequency of symptoms: <1x/week. Frequency of nocturnal symptoms: 0x/month. Frequency of SABA use: <1x/week. Interference with physical activity: no. Sleep is undisturbed. In the last 12 months, emergency room visits/urgent care visits/doctor office visits or hospitalizations due to respiratory issues: once. In the last 12 months, oral steroids courses: no Lifetime history of hospitalization for respiratory issues: no. Prior intubations: no. History of pneumonia: no. She was not evaluated by allergist/pulmonologist in the past. Smoking exposure: no. Up to date with flu vaccine: yes. Rhino conjunctivitis: yes Food allergy: yes  Banana, kiwi, mango, avocado, melons causes throat closure, lip swelling. No previous allergy testing for this. Dietary History: patient has been eating other foods including milk, eggs, peanut, treenuts, sesame, shellfish, seafood, soy, wheat, meats, fruits and vegetables.  Medication allergy: yes  Latex - pruritus Aspirin/NSAIDs - avoiding due to colon surgery.  Hymenoptera allergy: no Urticaria: no Eczema:no History of recurrent infections suggestive of immunodeficency: no  Diagnostics: Spirometry:  Tracings reviewed. Her effort: It was hard to get consistent efforts and there is a question as to whether this reflects a maximal maneuver. FVC: 4.61L FEV1: 3.83L, 107% predicted FEV1/FVC ratio: 83% Interpretation: Spirometry consistent with normal pattern.  Please see scanned spirometry results for details.  Skin Testing: Deferred due to recent antihistamines use.  Past Medical History: Patient Active Problem List   Diagnosis Date Noted  . Other allergic rhinitis 12/27/2017  . Adverse food reaction 12/27/2017  . Mild intermittent asthma without complication 32/44/0102    . 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 History of colonic polyps 04/26/2014   Past Medical History:  Diagnosis Date  . Allergy   . Asthma    no real dx; but requires use of an inhaler for occassional SOB, more with pregnancy  . GERD (gastroesophageal reflux disease)    OCCASIONAL   . Seasonal allergies   . Sinusitis   . Tubular adenoma of colon    with high grade dysplasia; greater than 50 tubular adenomas in 2016   Past Surgical History: Past Surgical History:  Procedure Laterality Date  . CESAREAN SECTION  2009  . COLONOSCOPY    . LAPAROSCOPIC PARTIAL COLECTOMY N/A 06/20/2014   Procedure: LAPAROSCOPIC TOTAL ABDOMINAL  COLECTOMY;  Surgeon: [name], MD;  Location: [facility];  Service: General;  Laterality: N/A;  . POLYPECTOMY    . TONSILLECTOMY     Medication List:  Current Outpatient Medications  Medication Sig Dispense Refill  . cetirizine (ZYRTEC) 5 MG tablet Take 5 mg by mouth daily.    Marland Kitchen PROAIR HFA 108 (90 BASE) MCG/ACT inhaler Inhale 2 puffs into the lungs every 4 (four) hours as needed for wheezing or shortness of breath.   3  . EPINEPHrine (EPIPEN 2-PAK) 0.3 mg/0.3 mL IJ SOAJ injection Inject 0.3 mLs (0.3 mg total) into the muscle once as needed for up to 1 dose. 2 Device 2   No current facility-administered medications for this visit.    Allergies: Allergies  Allergen Reactions  . Other Swelling    Melons  . Aspirin     Bleeding colon  polyps  . Avocado Swelling  . Banana Swelling  . Food Swelling    Mango,banana, & avacodo   Swelling of the lips  . Kiwi Extract Swelling  . Mangifera Indica Swelling  . Nsaids Other (See Comments)    Polyposis : colon, surgery sch  . Latex Rash   Social History: Social History   Socioeconomic History  . Marital status: Married    Spouse name: Not on file  . Number of children: 2  . Years of education: Not on file  . Highest education level: Not on file  Occupational History  . Occupation: Ship broker  Social Needs  .  Financial resource strain: Not on file  . Food insecurity:    Worry: Not on file    Inability: Not on file  . Transportation needs:    Medical: Not on file    Non-medical: Not on file  Tobacco Use  . Smoking status: Never Smoker  . Smokeless tobacco: Never Used  Substance and Sexual Activity  . Alcohol use: No    Alcohol/week: 0.0 standard drinks  . Drug use: No  . Sexual activity: Yes    Birth control/protection: Condom, IUD  Lifestyle  . Physical activity:    Days per week: Not on file    Minutes per session: Not on file  . Stress: Not on file  Relationships  . Social connections:    Talks on phone: Not on file    Gets together: Not on file    Attends religious service: Not on file    Active member of club or organization: Not on file    Attends meetings of clubs or organizations: Not on file    Relationship status: Not on file  Other Topics Concern  . Not on file  Social History Narrative  . Not on file   Lives in a house. Smoking: denies Occupation: not employed  Environmental HistoryFreight forwarder in the house: no Carpet in the family room: no Carpet in the bedroom: no Heating: electric Cooling: central Pet: yes 1 dog x 3 months.    Family History: Family History  Problem Relation Age of Onset  . Colon cancer Neg Hx   . Esophageal cancer Neg Hx   . Rectal cancer Neg Hx   . Stomach cancer Neg Hx   . Colon polyps Neg Hx    Problem                               Relation Asthma                                   Father  Eczema                                No  Food allergy                          No  Allergic rhino conjunctivitis     Father  Review of Systems  Constitutional: Negative for appetite change, chills, fever and unexpected weight change.  HENT: Negative for congestion and rhinorrhea.   Eyes: Negative for itching.  Respiratory: Negative for cough, chest tightness, shortness of breath and wheezing.   Cardiovascular: Negative for  chest pain.  Gastrointestinal: Negative for abdominal  pain.  Genitourinary: Negative for difficulty urinating.  Skin: Negative for rash.  Allergic/Immunologic: Positive for environmental allergies and food allergies.  Neurological: Negative for headaches.   Objective: BP 112/72 (BP Location: Left Arm, Patient Position: Sitting, Cuff Size: Normal)   Pulse 78   Temp 97.8 F (36.6 C) (Oral)   Resp 18   Ht 5' 6.5" (1.689 m)   Wt 195 lb (88.5 kg)   SpO2 98%   BMI 31.00 kg/m  Body mass index is 31 kg/m. Physical Exam  Constitutional: She is oriented to person, place, and time. She appears well-developed and well-nourished.  HENT:  Head: Normocephalic and atraumatic.  Right Ear: External ear normal.  Left Ear: External ear normal.  Nose: Mucosal edema (on right side) present.  Mouth/Throat: Oropharynx is clear and moist.  Eyes: Conjunctivae and EOM are normal.  Neck: Neck supple.  Cardiovascular: Normal rate, regular rhythm and normal heart sounds. Exam reveals no gallop and no friction rub.  No murmur heard. Pulmonary/Chest: Effort normal and breath sounds normal. She has no wheezes. She has no rales.  Abdominal: Soft. Bowel sounds are normal. There is no tenderness.  Lymphadenopathy:    She has no cervical adenopathy.  Neurological: She is alert and oriented to person, place, and time.  Skin: Skin is warm. No rash noted.  Psychiatric: She has a normal mood and affect. Her behavior is normal.  Nursing note and vitals reviewed.  The plan was reviewed with the patient/family, and all questions/concerned were addressed.  It was my pleasure to see Andrea Lang today and participate in her care. Please feel free to contact me with any questions or concerns.  Sincerely,  Rexene Alberts, DO Allergy & Immunology  [facility] office: [phone] [place]

## 2017-12-27 NOTE — Patient Instructions (Signed)
Continue to avoid foods that bother you. I have prescribed epinephrine injectable and demonstrated proper use. For mild symptoms you can take over the counter antihistamines such as Benadryl and monitor symptoms closely. If symptoms worsen or if you have severe symptoms including breathing issues, throat closure, significant swelling, whole body hives, severe diarrhea and vomiting, lightheadedness then inject epinephrine and seek immediate medical care afterwards.  May use albuterol rescue inhaler 2 puffs or nebulizer every 4 to 6 hours as needed for shortness of breath, chest tightness, coughing, and wheezing. May use albuterol rescue inhaler 2 puffs 5 to 15 minutes prior to strenuous physical activities.  Stop zyrtec after Wednesday Return for skin testing on Monday next week.

## 2017-12-27 NOTE — Assessment & Plan Note (Signed)
SOB and wheezing for the past 2-3 years for which albuterol helps. Using less than a few times per month.   Today's spirometry was normal.  May use albuterol rescue inhaler 2 puffs or nebulizer every 4 to 6 hours as needed for shortness of breath, chest tightness, coughing, and wheezing. May use albuterol rescue inhaler 2 puffs 5 to 15 minutes prior to strenuous physical activities.  Monitor symptoms.

## 2017-12-27 NOTE — Assessment & Plan Note (Signed)
Perennial rhinitis symptoms for the past 5 years with worsening in the summer. Patient was followed by ENT in [facility] and has been on weekly allergy immunotherapy with some benefit. Last skin testing was in 2018 - records not available for review. Will obtain results. Tried Singulair and Flonase in the past with minimal benefit.   Unable to skin test today due to recent antihistamine intake. Will return for skin testing as patient would like to resume injections at our office as it is closer to her home. No issues with allergy injections in the past except for some localized reactions.  May use over the counter antihistamines such as Zyrtec (cetirizine), Claritin (loratadine), Allegra (fexofenadine), or Xyzal (levocetirizine) daily as needed.  Will make additional recommendations based on results. Reviewed allergy immunotherapy information.

## 2018-01-03 ENCOUNTER — Ambulatory Visit: Payer: Medicaid Other | Admitting: Allergy

## 2018-01-26 HISTORY — DX: Maternal care for unspecified type scar from previous cesarean delivery: O34.219

## 2018-01-26 NOTE — L&D Delivery Note (Signed)
Delivery Note At 8:52 AM a viable and healthy female was delivered via VBAC, Spontaneous (Presentation: OA  ).  APGAR: 9, 9; weight 8 lb 15.2 oz (4060 g).   Placenta status: delivered, intact - manual extraction.  Cord: 3V with the following complications: none.    Anesthesia:  none Episiotomy: None Lacerations: Periurethral Suture Repair: 3.0 vicryl rapide Est. Blood Loss (mL): 54  Mom to postpartum.  Baby to Couplet care / Skin to Skin.  Rozell Theiler Bovard-Stuckert 12/10/2018, 7:15 AM

## 2018-02-02 ENCOUNTER — Ambulatory Visit (INDEPENDENT_AMBULATORY_CARE_PROVIDER_SITE_OTHER): Payer: Medicaid Other | Admitting: Allergy

## 2018-02-02 ENCOUNTER — Encounter: Payer: Self-pay | Admitting: Allergy

## 2018-02-02 VITALS — BP 118/70 | HR 66 | Ht 65.0 in | Wt 195.0 lb

## 2018-02-02 DIAGNOSIS — H1013 Acute atopic conjunctivitis, bilateral: Secondary | ICD-10-CM | POA: Diagnosis not present

## 2018-02-02 DIAGNOSIS — J3089 Other allergic rhinitis: Secondary | ICD-10-CM

## 2018-02-02 DIAGNOSIS — T781XXD Other adverse food reactions, not elsewhere classified, subsequent encounter: Secondary | ICD-10-CM | POA: Diagnosis not present

## 2018-02-02 DIAGNOSIS — J4520 Mild intermittent asthma, uncomplicated: Secondary | ICD-10-CM | POA: Diagnosis not present

## 2018-02-02 MED ORDER — MONTELUKAST SODIUM 10 MG PO TABS: 10.0000 mg | ORAL_TABLET | Freq: Every day | ORAL | 5 refills | Status: DC

## 2018-02-02 MED ORDER — FLUTICASONE PROPIONATE 50 MCG/ACT NA SUSP: 2.0000 | Freq: Every day | NASAL | 5 refills | Status: DC

## 2018-02-02 MED ORDER — OLOPATADINE HCL 0.7 % OP SOLN: 1.0000 [drp] | Freq: Every day | OPHTHALMIC | 5 refills | Status: DC

## 2018-02-02 NOTE — Progress Notes (Signed)
EXP 02/03/19

## 2018-02-02 NOTE — Assessment & Plan Note (Signed)
Past history - SOB and wheezing for the past 2-3 years for which albuterol helps. Using less than a few times per month.  Interim history - some wheezing while off antihistamines.  Today's spirometry was normal. Daily controller medication(s): NONE Prior to physical activity: May use albuterol rescue inhaler 2 puffs 5 to 15 minutes prior to strenuous physical activities. Rescue medications: May use albuterol rescue inhaler 2 puffs or nebulizer every 4 to 6 hours as needed for shortness of breath, chest tightness, coughing, and wheezing. Monitor frequency of use.

## 2018-02-02 NOTE — Assessment & Plan Note (Signed)
Past history - Perennial rhinitis symptoms for the past 5 years with worsening in the summer. Patient was followed by ENT in [facility] and has been on weekly allergy immunotherapy with some benefit. Last skin testing was in 2018 - records not available for review. Tried Singulair and Flonase in the past with minimal benefit.  Interim history - Nasal congestion worsened off antihistamines  Today's skin testing showed: positive to grass, ragweed, weed, trees, mold, dust mites.  Discussed environmental control measures.  May use pazeo 1 drop eye drop daily as needed for itchy/watery eyes.  Start Flonase 2 sprays daily.  Start Singulair 10mg  daily.  May use over the counter antihistamines such as Zyrtec (cetirizine), Claritin (loratadine), Allegra (fexofenadine), or Xyzal (levocetirizine) daily as needed.  Start allergy injections. Consent form was signed.

## 2018-02-02 NOTE — Patient Instructions (Addendum)
Other allergic rhinitis Today's skin testing showed: positive to grass, ragweed, weed, trees, mold, dust mites  May use pazeo 1 drop eye drop daily as needed for itchy/watery eyes.  Start Flonase 2 sprays daily.  May use over the counter antihistamines such as Zyrtec (cetirizine), Claritin (loratadine), Allegra (fexofenadine), or Xyzal (levocetirizine) daily as needed.  Start allergy injections.   Adverse food reaction Today's skin testing showed: negative to the foods.  The patients history suggests banana, kiwi, mango, avocado, melon  allergy, though todays skin tests were negative despite a positive histamine control.  Food allergen skin testing has excellent negative predictive value however there is still a 5% chance that the allergy exists. Therefore, we will investigate further with serum specific IgE levels and, if negative then schedule for open graded oral food challenge.  A laboratory order form has been provided   Until the food allergy has been definitively ruled out, the patient is to continue meticulous avoidance of banana, kiwi, mango, avocado, melon  and have access to epinephrine autoinjector 2 pack.  For mild symptoms you can take over the counter antihistamines such as Benadryl and monitor symptoms closely. If symptoms worsen or if you have severe symptoms including breathing issues, throat closure, significant swelling, whole body hives, severe diarrhea and vomiting, lightheadedness then inject epinephrine and seek immediate medical care afterwards.  Mild intermittent asthma without complication Daily controller medication(s): NONE Prior to physical activity: May use albuterol rescue inhaler 2 puffs 5 to 15 minutes prior to strenuous physical activities. Rescue medications: May use albuterol rescue inhaler 2 puffs or nebulizer every 4 to 6 hours as needed for shortness of breath, chest tightness, coughing, and wheezing. Monitor frequency of use.  Asthma control  goals:  Full participation in all desired activities (may need albuterol before activity) Albuterol use two times or less a week on average (not counting use with activity) Cough interfering with sleep two times or less a month Oral steroids no more than once a year No hospitalizations  Follow up in 4 months  Reducing Pollen Exposure . Pollen seasons: trees (spring), grass (summer) and ragweed/weeds (fall). Marland Kitchen Keep windows closed in your home and car to lower pollen exposure.  Susa Simmonds air conditioning in the bedroom and throughout the house if possible.  . Avoid going out in dry windy days - especially early morning. . Pollen counts are highest between 5 - 10 AM and on dry, hot and windy days.  . Save outside activities for late afternoon or after a heavy rain, when pollen levels are lower.  . Avoid mowing of grass if you have grass pollen allergy. Marland Kitchen Be aware that pollen can also be transported indoors on people and pets.  . Dry your clothes in an automatic dryer rather than hanging them outside where they might collect pollen.  . Rinse hair and eyes before bedtime. Mold Control . Mold and fungi can grow on a variety of surfaces provided certain temperature and moisture conditions exist.  . Outdoor molds grow on plants, decaying vegetation and soil. The major outdoor mold, Alternaria and Cladosporium, are found in very high numbers during hot and dry conditions. Generally, a late summer - fall peak is seen for common outdoor fungal spores. Rain will temporarily lower outdoor mold spore count, but counts rise rapidly when the rainy period ends. . The most important indoor molds are Aspergillus and Penicillium. Dark, humid and poorly ventilated basements are ideal sites for mold growth. The next most common sites of  mold growth are the bathroom and the kitchen. Outdoor (Seasonal) Mold Control . Use air conditioning and keep windows closed. . Avoid exposure to decaying vegetation. Marland Kitchen Avoid  leaf raking. . Avoid grain handling. . Consider wearing a face mask if working in moldy areas.  Indoor (Perennial) Mold Control  . Maintain humidity below 50%. . Get rid of mold growth on hard surfaces with water, detergent and, if necessary, 5% bleach (do not mix with other cleaners). Then dry the area completely. If mold covers an area more than 10 square feet, consider hiring an indoor environmental professional. . For clothing, washing with soap and water is best. If moldy items cannot be cleaned and dried, throw them away. . Remove sources e.g. contaminated carpets. . Repair and seal leaking roofs or pipes. Using dehumidifiers in damp basements may be helpful, but empty the water and clean units regularly to prevent mildew from forming. All rooms, especially basements, bathrooms and kitchens, require ventilation and cleaning to deter mold and mildew growth. Avoid carpeting on concrete or damp floors, and storing items in damp areas. Control of House Dust Mite Allergen . Dust mite allergens are a common trigger of allergy and asthma symptoms. While they can be found throughout the house, these microscopic creatures thrive in warm, humid environments such as bedding, upholstered furniture and carpeting. . Because so much time is spent in the bedroom, it is essential to reduce mite levels there.  . Encase pillows, mattresses, and box springs in special allergen-proof fabric covers or airtight, zippered plastic covers.  . Bedding should be washed weekly in hot water (130 F) and dried in a hot dryer. Allergen-proof covers are available for comforters and pillows that can't be regularly washed.  Wendee Copp the allergy-proof covers every few months. Minimize clutter in the bedroom. Keep pets out of the bedroom.  Marland Kitchen Keep humidity less than 50% by using a dehumidifier or air conditioning. You can buy a humidity measuring device called a hygrometer to monitor this.  . If possible, replace carpets with  hardwood, linoleum, or washable area rugs. If that's not possible, vacuum frequently with a vacuum that has a HEPA filter. . Remove all upholstered furniture and non-washable window drapes from the bedroom. . Remove all non-washable stuffed toys from the bedroom.  Wash stuffed toys weekly.

## 2018-02-02 NOTE — Progress Notes (Signed)
Follow Up Note  RE: DELARA SHEPHEARD MRN: [id number] DOB: 1990/02/10 Date of Office Visit: 02/02/2018  Referring provider: Berkley Harvey, NP Primary care provider: Berkley Harvey, NP  Chief Complaint: Allergy Testing  History of Present Illness: I had the pleasure of seeing Mintie Witherington for a follow up visit at the [facility] on 02/02/2018. She is a 28 y.o. female, who is being followed for allergic rhinitis, asthma, and food allergies. Today she is here for skin testing. Her previous allergy office visit was on 12/27/2017 with Dr. Maudie Mercury.   Other allergic rhinitis Symptoms worse since off antihistamines. Interested in starting allergy injections. Not using any regular nasal sprays.   Adverse food reaction Currently avoiding banana, kiwi, mango, avocado, melon as they cause throat closure and lip swelling.   Mild intermittent asthma without complication Noticed some wheezing since off antihistamines but did not have to use albuterol.  Assessment and Plan: Merial is a 28 y.o. female with: Other allergic rhinitis Past history - Perennial rhinitis symptoms for the past 5 years with worsening in the summer. Patient was followed by ENT in [facility] and has been on weekly allergy immunotherapy with some benefit. Last skin testing was in 2018 - records not available for review. Tried Singulair and Flonase in the past with minimal benefit.  Interim history - Nasal congestion worsened off antihistamines  Today's skin testing showed: positive to grass, ragweed, weed, trees, mold, dust mites.  Discussed environmental control measures.  May use pazeo 1 drop eye drop daily as needed for itchy/watery eyes.  Start Flonase 2 sprays daily.  Start Singulair 10mg  daily.  May use over the counter antihistamines such as Zyrtec (cetirizine), Claritin (loratadine), Allegra (fexofenadine), or Xyzal (levocetirizine) daily as needed.  Start allergy injections. Consent form was signed.    Allergic conjunctivitis of both eyes See assessment and plan as above.  Adverse food reaction Past history - Currently avoiding banana, kiwi, mango, avocado, melon as they cause throat closure and lip swelling.  Interim history - had some avocado since last visit which caused mouth pruritus.  Today's skin testing was negative to: Avocado, banana and watermelon.  Did not have kiwi or mango and skin testing.  The patients history suggests banana, kiwi, mango, avocado, melon  allergy, though todays skin tests were negative despite a positive histamine control.  Food allergen skin testing has excellent negative predictive value however there is still a 5% chance that the allergy exists. Therefore, we will investigate further with serum specific IgE levels and, if negative then schedule for open graded oral food challenge.  A laboratory order form has been provided   Until the food allergy has been definitively ruled out, the patient is to continue meticulous avoidance of banana, kiwi, mango, avocado, melon  and have access to epinephrine autoinjector 2 pack.  For mild symptoms you can take over the counter antihistamines such as Benadryl and monitor symptoms closely. If symptoms worsen or if you have severe symptoms including breathing issues, throat closure, significant swelling, whole body hives, severe diarrhea and vomiting, lightheadedness then inject epinephrine and seek immediate medical care afterwards.  Mild intermittent asthma without complication Past history - SOB and wheezing for the past 2-3 years for which albuterol helps. Using less than a few times per month.  Interim history - some wheezing while off antihistamines.  Today's spirometry was normal. Daily controller medication(s): NONE Prior to physical activity: May use albuterol rescue inhaler 2 puffs 5  to 15 minutes prior to strenuous physical activities. Rescue medications: May use albuterol rescue inhaler 2 puffs or  nebulizer every 4 to 6 hours as needed for shortness of breath, chest tightness, coughing, and wheezing. Monitor frequency of use.   Return in about 4 months (around 06/03/2018).  Meds ordered this encounter  Medications  . montelukast (SINGULAIR) 10 MG tablet    Sig: Take 1 tablet (10 mg total) by mouth at bedtime.    Dispense:  30 tablet    Refill:  5  . Olopatadine HCl (PAZEO) 0.7 % SOLN    Sig: Place 1 drop into both eyes daily. As needed.    Dispense:  1 Bottle    Refill:  5  . fluticasone (FLONASE) 50 MCG/ACT nasal spray    Sig: Place 2 sprays into both nostrils daily.    Dispense:  16 g    Refill:  5    Lab Orders     Allergen, Banana f92     Allergen, Kiwi Fruit, f84     Allergen, Mango, f91     Allergen Avocado f96     Cantaloupe IgE     Watermelon IgE  Diagnostics: Skin Testing: Environmental allergy panel and select foods Positive test to: grass, ragweed, weed, trees, mold, dust mites. Negative test to: foods.  Results discussed with patient/family. Airborne Adult Perc - 02/02/18 0943    Allergen Manufacturer  Lavella Hammock    Location  Back    Number of Test  59    Panel 1  Select    1. Control-Buffer 50% Glycerol  Negative    2. Control-Histamine 1 mg/ml  2+    3. Albumin saline  Negative    4. [place]  4+    5. [place]  4+    6. [place]  4+    7. Zearing Blue  Negative    8. Meadow Fescue  Negative    9. Perennial Rye  Negative    10. Sweet Vernal  Negative    11. Timothy  Negative    12. Cocklebur  Negative    13. Burweed Marshelder  Negative    14. Ragweed, short  Negative    15. Ragweed, Giant  Negative    16. Plantain,  English  3+    17. Lamb's Quarters  Negative    18. Sheep Sorrell  3+    19. Rough Pigweed  3+    20. Marsh Elder, Rough  2+    21. Mugwort, Common  Negative    22. Ash mix  2+    23. Birch mix  Negative    24. Beech American  3+    25. Box, Elder  Negative    26. Cedar, red  2+    27. Cottonwood, Eastern  4+    28. Elm mix  3+      29. Hickory mix  Negative    30. Maple mix  Negative    31. Oak, [place] mix  3+    32. Pecan Pollen  Negative    33. Pine mix  2+    34. Sycamore Eastern  3+    35. [place], Black Pollen  4+    36. Alternaria alternata  4+    37. Cladosporium Herbarum  Negative    38. Aspergillus mix  3+    39. Penicillium mix  3+    40. Bipolaris sorokiniana (Helminthosporium)  2+    41. Drechslera spicifera (Curvularia)  3+  42. Mucor plumbeus  Negative    43. Fusarium moniliforme  4+    44. Aureobasidium pullulans (pullulara)  Negative    45. Rhizopus oryzae  Negative    46. Botrytis cinera  4+    47. Epicoccum nigrum  3+    48. Phoma betae  2+    49. Candida Albicans  Negative    50. Trichophyton mentagrophytes  Negative    51. Mite, D Farinae  5,000 AU/ml  Negative    52. Mite, D Pteronyssinus  5,000 AU/ml  Negative    53. Cat Hair 10,000 BAU/ml  Negative    54.  Dog Epithelia  Negative    55. Mixed Feathers  Negative    56. Horse Epithelia  Negative    57. Cockroach, German  Negative    58. Mouse  Negative    59. Tobacco Leaf  Negative     Intradermal - 02/02/18 1029    Time Antigen Placed  1030    Allergen Manufacturer  Greer    Location  Back    Number of Test  7    Intradermal  Select    Control  Negative    7 Grass  4+    Ragweed mix  4+    Cat  Negative    Dog  Negative    Cockroach  Negative    Mite mix  4+     Food Adult Perc - 02/02/18 0943    Time Antigen Placed  0957    Allergen Manufacturer  Lavella Hammock    Location  Back    Number of allergen test  5     Control-buffer 50% Glycerol  Negative    Control-Histamine 1 mg/ml  2+    48. Avocado  Negative    57. Banana  Negative    62. Watermelon  Negative       Medication List:  Current Outpatient Medications  Medication Sig Dispense Refill  . EPINEPHrine (EPIPEN 2-PAK) 0.3 mg/0.3 mL IJ SOAJ injection Inject 0.3 mLs (0.3 mg total) into the muscle once as needed for up to 1 dose. 2 Device 2  . PROAIR HFA 108 (90  BASE) MCG/ACT inhaler Inhale 2 puffs into the lungs every 4 (four) hours as needed for wheezing or shortness of breath.   3  . cetirizine (ZYRTEC) 5 MG tablet Take 5 mg by mouth daily.    . fluticasone (FLONASE) 50 MCG/ACT nasal spray Place 2 sprays into both nostrils daily. 16 g 5  . montelukast (SINGULAIR) 10 MG tablet Take 1 tablet (10 mg total) by mouth at bedtime. 30 tablet 5  . Olopatadine HCl (PAZEO) 0.7 % SOLN Place 1 drop into both eyes daily. As needed. 1 Bottle 5   No current facility-administered medications for this visit.    Allergies: Allergies  Allergen Reactions  . Other Swelling    Melons  . Aspirin     Bleeding colon polyps  . Avocado Swelling  . Banana Swelling  . Food Swelling    Mango,banana, & avacodo   Swelling of the lips  . Kiwi Extract Swelling  . Mangifera Indica Swelling  . Nsaids Other (See Comments)    Polyposis : colon, surgery sch  . Latex Rash   I reviewed her past medical history, social history, family history, and environmental history and no significant changes have been reported from previous visit on 12/27/2017.  Review of Systems  Constitutional: Negative for appetite change, chills, fever and unexpected weight change.  HENT: Positive for congestion. Negative for rhinorrhea.   Eyes: Negative for itching.  Respiratory: Positive for wheezing. Negative for cough, chest tightness and shortness of breath.   Cardiovascular: Negative for chest pain.  Gastrointestinal: Negative for abdominal pain.  Genitourinary: Negative for difficulty urinating.  Skin: Negative for rash.  Allergic/Immunologic: Positive for environmental allergies and food allergies.  Neurological: Negative for headaches.   Objective: BP 118/70   Pulse 66   Ht 5\' 5"  (1.651 m)   Wt 195 lb (88.5 kg)   SpO2 99%   BMI 32.45 kg/m  Body mass index is 32.45 kg/m. Physical Exam  Constitutional: She is oriented to person, place, and time. She appears well-developed and  well-nourished.  HENT:  Head: Normocephalic and atraumatic.  Right Ear: External ear normal.  Left Ear: External ear normal.  Nose: Mucosal edema (on right side) present.  Mouth/Throat: Oropharynx is clear and moist.  Eyes: Conjunctivae and EOM are normal.  Neck: Neck supple.  Cardiovascular: Normal rate, regular rhythm and normal heart sounds. Exam reveals no gallop and no friction rub.  No murmur heard. Pulmonary/Chest: Effort normal and breath sounds normal. She has no wheezes. She has no rales.  Abdominal: Soft.  Neurological: She is alert and oriented to person, place, and time.  Skin: Skin is warm. No rash noted.  Psychiatric: She has a normal mood and affect. Her behavior is normal.  Nursing note and vitals reviewed.  Previous notes and tests were reviewed. The plan was reviewed with the patient/family, and all questions/concerned were addressed.  It was my pleasure to see Andrea Lang today and participate in her care. Please feel free to contact me with any questions or concerns.  Sincerely,  Rexene Alberts, DO Allergy & Immunology  [facility] office: [phone] [facility] office: [phone]

## 2018-02-02 NOTE — Assessment & Plan Note (Signed)
.   See assessment and plan as above. 

## 2018-02-02 NOTE — Assessment & Plan Note (Signed)
Past history - Currently avoiding banana, kiwi, mango, avocado, melon as they cause throat closure and lip swelling.  Interim history - had some avocado since last visit which caused mouth pruritus.  Today's skin testing was negative to: Avocado, banana and watermelon.  Did not have kiwi or mango and skin testing.  The patients history suggests banana, kiwi, mango, avocado, melon  allergy, though todays skin tests were negative despite a positive histamine control.  Food allergen skin testing has excellent negative predictive value however there is still a 5% chance that the allergy exists. Therefore, we will investigate further with serum specific IgE levels and, if negative then schedule for open graded oral food challenge.  A laboratory order form has been provided   Until the food allergy has been definitively ruled out, the patient is to continue meticulous avoidance of banana, kiwi, mango, avocado, melon  and have access to epinephrine autoinjector 2 pack.  For mild symptoms you can take over the counter antihistamines such as Benadryl and monitor symptoms closely. If symptoms worsen or if you have severe symptoms including breathing issues, throat closure, significant swelling, whole body hives, severe diarrhea and vomiting, lightheadedness then inject epinephrine and seek immediate medical care afterwards.

## 2018-02-03 DIAGNOSIS — J3089 Other allergic rhinitis: Secondary | ICD-10-CM

## 2018-02-04 DIAGNOSIS — J301 Allergic rhinitis due to pollen: Secondary | ICD-10-CM | POA: Diagnosis not present

## 2018-02-05 LAB — ALLERGEN, KIWI FRUIT, F84: Kiwi Fruit: 0.22 kU/L — AB

## 2018-02-05 LAB — ALLERGEN BANANA: Allergen Banana IgE: 0.56 kU/L — AB

## 2018-02-05 LAB — ALLERGEN AVOCADO F96: F096-IgE Avocado: 0.18 kU/L — AB

## 2018-02-05 LAB — ALLERGEN WATERMELON: Allergen Watermelon IgE: <0.1 kU/L

## 2018-02-05 LAB — ALLERGEN CANTALOUPE: Allergen Melon IgE: <0.1 kU/L

## 2018-02-05 LAB — ALLERGEN, MANGO, F91: Mango IgE: <0.1 kU/L

## 2018-02-07 ENCOUNTER — Telehealth: Payer: Self-pay | Admitting: Allergy

## 2018-02-07 NOTE — Telephone Encounter (Signed)
Informed patient of lab results. Will schedule appointment for challenge when she comes in for next appointment.

## 2018-02-16 ENCOUNTER — Ambulatory Visit (INDEPENDENT_AMBULATORY_CARE_PROVIDER_SITE_OTHER): Payer: Medicaid Other

## 2018-02-16 DIAGNOSIS — J309 Allergic rhinitis, unspecified: Secondary | ICD-10-CM | POA: Diagnosis not present

## 2018-02-21 ENCOUNTER — Ambulatory Visit (INDEPENDENT_AMBULATORY_CARE_PROVIDER_SITE_OTHER): Payer: Medicaid Other

## 2018-02-21 DIAGNOSIS — J309 Allergic rhinitis, unspecified: Secondary | ICD-10-CM | POA: Diagnosis not present

## 2018-02-24 ENCOUNTER — Ambulatory Visit (INDEPENDENT_AMBULATORY_CARE_PROVIDER_SITE_OTHER): Payer: Medicaid Other | Admitting: *Deleted

## 2018-02-24 DIAGNOSIS — J309 Allergic rhinitis, unspecified: Secondary | ICD-10-CM | POA: Diagnosis not present

## 2018-02-28 ENCOUNTER — Ambulatory Visit (INDEPENDENT_AMBULATORY_CARE_PROVIDER_SITE_OTHER): Payer: Medicaid Other | Admitting: *Deleted

## 2018-02-28 DIAGNOSIS — J309 Allergic rhinitis, unspecified: Secondary | ICD-10-CM | POA: Diagnosis not present

## 2018-03-02 ENCOUNTER — Ambulatory Visit (INDEPENDENT_AMBULATORY_CARE_PROVIDER_SITE_OTHER): Payer: Medicaid Other

## 2018-03-02 DIAGNOSIS — J309 Allergic rhinitis, unspecified: Secondary | ICD-10-CM | POA: Diagnosis not present

## 2018-03-09 ENCOUNTER — Ambulatory Visit (INDEPENDENT_AMBULATORY_CARE_PROVIDER_SITE_OTHER): Payer: Medicaid Other | Admitting: *Deleted

## 2018-03-09 DIAGNOSIS — J309 Allergic rhinitis, unspecified: Secondary | ICD-10-CM | POA: Diagnosis not present

## 2018-03-25 ENCOUNTER — Ambulatory Visit (INDEPENDENT_AMBULATORY_CARE_PROVIDER_SITE_OTHER): Payer: Medicaid Other | Admitting: *Deleted

## 2018-03-25 DIAGNOSIS — J309 Allergic rhinitis, unspecified: Secondary | ICD-10-CM | POA: Diagnosis not present

## 2018-04-01 ENCOUNTER — Telehealth: Payer: Self-pay | Admitting: *Deleted

## 2018-04-01 NOTE — Telephone Encounter (Signed)
Patient called stating that she is [redacted] weeks pregnant and wanted to call to get advice on if she should continue allergy shots. Last dose received was 0.5 out of her blue vials on 03/25/2018. Please advise. Advised patient not to come in for injections until she hears back from [place].

## 2018-04-01 NOTE — Telephone Encounter (Signed)
Please call back patient.  We can't build up allergy injections during pregnancy due to risk of anaphylaxis.  As she only had built up to blue vial, I recommend that we stop and restart once pregnancy is over.   She may continue to use Flonase 2 spray daily. May use pazeo eye drops as needed.  Stop singulair during pregnancy.  May use over the counter antihistamines such as Zyrtec (cetirizine), Claritin (loratadine), Allegra (fexofenadine), or Xyzal (levocetirizine) daily as needed.  Keep follow up visit in May. If symptoms worsen then make sooner visit.

## 2018-04-04 NOTE — Telephone Encounter (Signed)
Patient inform to discontinue shots and Singulair until after pregnancy.

## 2018-05-19 LAB — OB RESULTS CONSOLE RUBELLA ANTIBODY, IGM: Rubella: IMMUNE

## 2018-05-19 LAB — OB RESULTS CONSOLE GC/CHLAMYDIA
Chlamydia: NEGATIVE
Gonorrhea: NEGATIVE

## 2018-05-19 LAB — OB RESULTS CONSOLE ABO/RH: RH Type: POSITIVE

## 2018-05-19 LAB — OB RESULTS CONSOLE HEPATITIS B SURFACE ANTIGEN: Hepatitis B Surface Ag: NEGATIVE

## 2018-05-19 LAB — OB RESULTS CONSOLE RPR: RPR: NONREACTIVE

## 2018-05-19 LAB — OB RESULTS CONSOLE HIV ANTIBODY (ROUTINE TESTING): HIV: NONREACTIVE

## 2018-05-19 LAB — OB RESULTS CONSOLE ANTIBODY SCREEN: Antibody Screen: NEGATIVE

## 2018-05-25 ENCOUNTER — Other Ambulatory Visit: Payer: Self-pay

## 2018-05-25 ENCOUNTER — Encounter (HOSPITAL_COMMUNITY): Payer: Self-pay | Admitting: *Deleted

## 2018-05-25 ENCOUNTER — Inpatient Hospital Stay (HOSPITAL_COMMUNITY)
Admission: AD | Admit: 2018-05-25 | Discharge: 2018-05-25 | Disposition: A | Discharge status: Home / Self Care | Payer: Medicaid Other | Source: Ambulatory Visit | Attending: Obstetrics and Gynecology | Admitting: Obstetrics and Gynecology

## 2018-05-25 DIAGNOSIS — O34219 Maternal care for unspecified type scar from previous cesarean delivery: Secondary | ICD-10-CM | POA: Diagnosis not present

## 2018-05-25 DIAGNOSIS — Z91040 Latex allergy status: Secondary | ICD-10-CM | POA: Diagnosis not present

## 2018-05-25 DIAGNOSIS — O219 Vomiting of pregnancy, unspecified: Secondary | ICD-10-CM | POA: Insufficient documentation

## 2018-05-25 DIAGNOSIS — Z79899 Other long term (current) drug therapy: Secondary | ICD-10-CM | POA: Diagnosis not present

## 2018-05-25 DIAGNOSIS — Z91018 Allergy to other foods: Secondary | ICD-10-CM | POA: Diagnosis not present

## 2018-05-25 DIAGNOSIS — Z3A11 11 weeks gestation of pregnancy: Secondary | ICD-10-CM

## 2018-05-25 DIAGNOSIS — Z886 Allergy status to analgesic agent status: Secondary | ICD-10-CM | POA: Insufficient documentation

## 2018-05-25 LAB — COMPREHENSIVE METABOLIC PANEL
ALT: 25 U/L (ref 0–44)
AST: 21 U/L (ref 15–41)
Albumin: 3.4 g/dL — ABNORMAL LOW (ref 3.5–5.0)
Alkaline Phosphatase: 38 U/L (ref 38–126)
Anion gap: 10 (ref 5–15)
BUN: 9 mg/dL (ref 6–20)
CO2: 21 mmol/L — ABNORMAL LOW (ref 22–32)
Calcium: 8.9 mg/dL (ref 8.9–10.3)
Chloride: 104 mmol/L (ref 98–111)
Creatinine, Ser: 0.47 mg/dL (ref 0.44–1.00)
Glucose, Bld: 81 mg/dL (ref 70–99)
Potassium: 3.7 mmol/L (ref 3.5–5.1)
Sodium: 135 mmol/L (ref 135–145)
Total Bilirubin: 0.5 mg/dL (ref 0.3–1.2)
Total Protein: 6.3 g/dL — ABNORMAL LOW (ref 6.5–8.1)

## 2018-05-25 LAB — URINALYSIS, ROUTINE W REFLEX MICROSCOPIC
Bilirubin Urine: NEGATIVE
Glucose, UA: NEGATIVE mg/dL
Hgb urine dipstick: NEGATIVE
Ketones, ur: 80 mg/dL — AB
Leukocytes,Ua: NEGATIVE
Nitrite: NEGATIVE
Protein, ur: NEGATIVE mg/dL
Specific Gravity, Urine: 1.021 (ref 1.005–1.030)
pH: 6 (ref 5.0–8.0)

## 2018-05-25 MED ORDER — LACTATED RINGERS IV BOLUS
500.0000 mL | Freq: Once | INTRAVENOUS | Status: AC
  Administered 2018-05-25: 500 mL via INTRAVENOUS

## 2018-05-25 MED ORDER — PROMETHAZINE HCL 25 MG PO TABS: 25.0000 mg | ORAL_TABLET | Freq: Four times a day (QID) | ORAL | 2 refills | Status: DC | PRN

## 2018-05-25 MED ORDER — LACTATED RINGERS IV SOLN
INTRAVENOUS | Status: DC
  Administered 2018-05-25: 20:00:00 via INTRAVENOUS

## 2018-05-25 MED ORDER — LACTATED RINGERS IV BOLUS
1000.0000 mL | Freq: Once | INTRAVENOUS | Status: AC
  Administered 2018-05-25: 1000 mL via INTRAVENOUS

## 2018-05-25 MED ORDER — ONDANSETRON 4 MG PO TBDP: 4.0000 mg | ORAL_TABLET | Freq: Four times a day (QID) | ORAL | 0 refills | Status: DC | PRN

## 2018-05-25 MED ORDER — SODIUM CHLORIDE 0.9 % IV SOLN
8.0000 mg | Freq: Once | INTRAVENOUS | Status: DC
  Filled 2018-05-25: qty 4

## 2018-05-25 MED ORDER — ONDANSETRON 4 MG PO TBDP
8.0000 mg | ORAL_TABLET | Freq: Once | ORAL | Status: AC
  Administered 2018-05-25: 8 mg via ORAL
  Filled 2018-05-25: qty 2

## 2018-05-25 NOTE — MAU Provider Note (Addendum)
Patient Andrea Lang is a 28 y.o. [phone] At [redacted]w[redacted]d by LMP (c/w with early [place]) here with complaints of nausea and vomiting that started yesterday morning at 10 am. She denies sick contacts, diarrhea, fever, SOB, chills, dysuria, unusual vaginal bleeding or vaginal discharge.  History     CSN: [id number]  Arrival date and time: 05/25/18 1806   First Provider Initiated Contact with Patient 05/25/18 1918      Chief Complaint  Patient presents with  . Emesis   Emesis   This is a new problem. The current episode started yesterday. The problem occurs 5 to 10 times per day. The problem has been unchanged. The emesis has an appearance of bile and stomach contents. There has been no fever. Pertinent negatives include no chills, coughing, dizziness or fever. She has tried acetaminophen for the symptoms.   She had her first prenatal visit one week ago on Thursday, and called her MD office today for N/V. She was given Reglan, which she took today. It did not provide any relief.    OB History    Gravida  5   Para  2   Term  2   Preterm      AB  2   Living  2     SAB  2   TAB      Ectopic      Multiple      Live Births  2           Past Medical History:  Diagnosis Date  . Allergy   . Asthma    no real dx; but requires use of an inhaler for occassional SOB, more with pregnancy  . GERD (gastroesophageal reflux disease)    OCCASIONAL   . Seasonal allergies   . Sinusitis   . 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 CESAREAN SECTION  2009  . COLONOSCOPY    . LAPAROSCOPIC PARTIAL COLECTOMY N/A 06/20/2014   Procedure: LAPAROSCOPIC TOTAL ABDOMINAL  COLECTOMY;  Surgeon: [name], MD;  Location: [facility];  Service: General;  Laterality: N/A;  . POLYPECTOMY    . TONSILLECTOMY      Family History  Problem Relation Age of Onset  . Colon cancer Neg Hx   .  Esophageal cancer Neg Hx   . Rectal cancer Neg Hx   . Stomach cancer Neg Hx   . Colon polyps Neg Hx     Social History   Tobacco Use  . Smoking status: Never Smoker  . Smokeless tobacco: Never Used  Substance Use Topics  . Alcohol use: No    Alcohol/week: 0.0 standard drinks  . Drug use: No    Allergies:  Allergies  Allergen Reactions  . Other Swelling    Melons  . Aspirin     Bleeding colon polyps  . Avocado Swelling  . Banana Swelling  . Food Swelling    Mango,banana, & avacodo   Swelling of the lips  . Kiwi Extract Swelling  . Mangifera Indica Swelling  . Nsaids Other (See Comments)    Polyposis : colon, surgery sch  . Latex Rash    Medications Prior to Admission  Medication Sig Dispense Refill Last Dose  . cetirizine (ZYRTEC) 5 MG tablet Take 5 mg by mouth daily.   05/24/2018 at Unknown time  . EPINEPHrine (EPIPEN 2-PAK) 0.3 mg/0.3 mL IJ  SOAJ injection Inject 0.3 mLs (0.3 mg total) into the muscle once as needed for up to 1 dose. 2 Device 2 Taking  . fluticasone (FLONASE) 50 MCG/ACT nasal spray Place 2 sprays into both nostrils daily. 16 g 5   . montelukast (SINGULAIR) 10 MG tablet Take 1 tablet (10 mg total) by mouth at bedtime. 30 tablet 5   . Olopatadine HCl (PAZEO) 0.7 % SOLN Place 1 drop into both eyes daily. As needed. 1 Bottle 5   . PROAIR HFA 108 (90 BASE) MCG/ACT inhaler Inhale 2 puffs into the lungs every 4 (four) hours as needed for wheezing or shortness of breath.   3 More than a month at Unknown time    Review of Systems  Constitutional: Negative for chills and fever.  Respiratory: Negative for cough.   Gastrointestinal: Positive for vomiting.  Neurological: Negative for dizziness.   Physical Exam   Blood pressure 129/70, pulse 81, temperature 98.4 F (36.9 C), temperature source Oral, resp. rate 16, weight 83.7 kg, last menstrual period 11/21/2017, SpO2 100 %, unknown if currently breastfeeding.  Physical Exam  Constitutional: She is oriented  to person, place, and time. She appears well-developed and well-nourished.  HENT:  Head: Normocephalic.  Neck: Normal range of motion.  GI: Soft.  Musculoskeletal: Normal range of motion.  Neurological: She is alert and oriented to person, place, and time.  Skin: Skin is warm and dry.    MAU Course  Procedures  MDM -UA positive for ketones; will give IV fluids and ODT Zofran -CMP shows no abnormalities -FHR is 160 by Doppler  Patient care endorsed to [place], [place] Assessment and [place] 05/25/2018, 7:19 PM   Feels better No vomiting Tolerating POs  Discharged home  [place], [place]

## 2018-05-25 NOTE — Discharge Instructions (Signed)
Morning Sickness    Morning sickness is when you feel sick to your stomach (nauseous) during pregnancy. You may feel sick to your stomach and throw up (vomit). You may feel sick in the morning, but you can feel this way at any time of day. Some women feel very sick to their stomach and cannot stop throwing up (hyperemesis gravidarum).  Follow these instructions at home:  Medicines   Take over-the-counter and prescription medicines only as told by your doctor. Do not take any medicines until you talk with your doctor about them first.   Taking multivitamins before getting pregnant can stop or lessen the harshness of morning sickness.  Eating and drinking   Eat dry toast or crackers before getting out of bed.   Eat 5 or 6 small meals a day.   Eat dry and bland foods like rice and baked potatoes.   Do not eat greasy, fatty, or spicy foods.   Have someone cook for you if the smell of food causes you to feel sick or throw up.   If you feel sick to your stomach after taking prenatal vitamins, take them at night or with a snack.   Eat protein when you need a snack. Nuts, yogurt, and cheese are good choices.   Drink fluids throughout the day.   Try ginger ale made with real ginger, ginger tea made from fresh grated ginger, or ginger candies.  General instructions   Do not use any products that have nicotine or tobacco in them, such as cigarettes and e-cigarettes. If you need help quitting, ask your doctor.   Use an air purifier to keep the air in your house free of smells.   Get lots of fresh air.   Try to avoid smells that make you feel sick.   Try:  ? Wearing a bracelet that is used for seasickness (acupressure wristband).  ? Going to a doctor who puts thin needles into certain body points (acupuncture) to improve how you feel.  Contact a doctor if:   You need medicine to feel better.   You feel dizzy or light-headed.   You are losing weight.  Get help right away if:   You feel very sick to your  stomach and cannot stop throwing up.   You pass out (faint).   You have very bad pain in your belly.  Summary   Morning sickness is when you feel sick to your stomach (nauseous) during pregnancy.   You may feel sick in the morning, but you can feel this way at any time of day.   Making some changes to what you eat may help your symptoms go away.  This information is not intended to replace advice given to you by your health care provider. Make sure you discuss any questions you have with your health care provider.  Document Released: 02/20/2004 Document Revised: 02/13/2016 Document Reviewed: 02/13/2016  Elsevier Interactive Patient Education  2019 [employer]vier Inc[employer]

## 2018-05-25 NOTE — MAU Note (Signed)
Started throwing up yesterday after breakfast, continued all day and today.  Had been prescribed some nausea medicine- didn't really seem to help.  Called office, was offered suppositories or to come in for IV fluids.

## 2018-06-06 ENCOUNTER — Ambulatory Visit (INDEPENDENT_AMBULATORY_CARE_PROVIDER_SITE_OTHER): Payer: Medicaid Other | Admitting: Allergy

## 2018-06-06 ENCOUNTER — Other Ambulatory Visit: Payer: Self-pay

## 2018-06-06 ENCOUNTER — Encounter: Payer: Self-pay | Admitting: Allergy

## 2018-06-06 DIAGNOSIS — J4520 Mild intermittent asthma, uncomplicated: Secondary | ICD-10-CM | POA: Diagnosis not present

## 2018-06-06 DIAGNOSIS — Z3492 Encounter for supervision of normal pregnancy, unspecified, second trimester: Secondary | ICD-10-CM

## 2018-06-06 DIAGNOSIS — J3089 Other allergic rhinitis: Secondary | ICD-10-CM

## 2018-06-06 DIAGNOSIS — J302 Other seasonal allergic rhinitis: Secondary | ICD-10-CM | POA: Diagnosis not present

## 2018-06-06 DIAGNOSIS — T781XXD Other adverse food reactions, not elsewhere classified, subsequent encounter: Secondary | ICD-10-CM | POA: Diagnosis not present

## 2018-06-06 DIAGNOSIS — H1010 Acute atopic conjunctivitis, unspecified eye: Secondary | ICD-10-CM | POA: Diagnosis not present

## 2018-06-06 MED ORDER — BUDESONIDE 32 MCG/ACT NA SUSP: NASAL | 5 refills | Status: DC

## 2018-06-06 NOTE — Assessment & Plan Note (Signed)
Past history - Currently avoiding banana, kiwi, mango, avocado, melon as they cause throat closure and lip swelling. 2020 skin testing was negative to: Avocado, banana and watermelon. 2020 bloodwork was positive to: banana, kiwi, and borderline to avocado. The melons were negative.  Interim history - No accidental ingestion.   Continue meticulous avoidance of banana, kiwi, mango, avocado, melon and have access to epinephrine autoinjector 2 pack. If interested, we can schedule for mango in office food challenge once done with pregnancy.  For mild symptoms you can take over the counter antihistamines such as Benadryl and monitor symptoms closely. If symptoms worsen or if you have severe symptoms including breathing issues, throat closure, significant swelling, whole body hives, severe diarrhea and vomiting, lightheadedness then inject epinephrine and seek immediate medical care afterwards.

## 2018-06-06 NOTE — Progress Notes (Signed)
RE: Andrea Lang MRN: [id number] DOB: August 26, 1990 Date of Telemedicine Visit: 06/06/2018  Referring provider: Berkley Harvey, NP Primary care provider: Berkley Harvey, NP  Chief Complaint: Allergic Rhinitis  (woke up this morning with nasal congestion, feels like cetirizine is not helping); Asthma (has not needed to use her inhaler); and Food Intolerance (no accidental exposures)   Telemedicine Follow Up Visit via Telephone: I connected with Andrea Lang for a follow up on 06/06/18 by telephone and verified that I am speaking with the correct person using two identifiers.   I discussed the limitations, risks, security and privacy concerns of performing an evaluation and management service by telephone and the availability of in person appointments. I also discussed with the patient that there may be a patient responsible charge related to this service. The patient expressed understanding and agreed to proceed.  Patient is at home. Provider is at the office.  Visit start time: 9:53AM Visit end time: 10:10AM Insurance consent/check in by: Tobie Lords. Medical consent and medical assistant/nurse: Zerita Boers.  History of Present Illness: She is a 28 y.o. female, who is being followed for allergic rhino conjunctivitis, asthma, and food allergies. Her previous allergy office visit was on 02/02/2018 with Dr. Maudie Mercury. Today is a regular follow up visit.  Currently pregnant at [redacted] weeks and is due in November 2020. This is not her first pregnancy.   Other allergic rhinitis Feeling increased nasal congestion. Currently on zyrtec 10mg  daily with no benefit. She has been using nettipot and saline spray.   Patient was taking Flonase with some benefit but stopped once she got pregnant.  Patient stopped allergy injections and Singulair as well.   Adverse food reaction Currently avoiding banana, kiwi, mango, avocado, melon. No accidental ingestions.   Mild intermittent asthma without complication Denies any  SOB, coughing, wheezing, chest tightness, nocturnal awakenings, ER/urgent care visits or prednisone use since the last visit. Rescue inhaler use about 1 month ago with good benefit.   Assessment and Plan: Aili is a 28 y.o. female with: Mild intermittent asthma without complication Well-controlled.   Daily controller medication(s):NONE  Prior to physical activity:May use albuterol rescue inhaler 2 puffs 5 to 15 minutes prior to strenuous physical activities.  Rescue medications:May use albuterol rescue inhaler 2 puffs or nebulizer every 4 to 6 hours as needed for shortness of breath, chest tightness, coughing, and wheezing. Monitor frequency of use.   Seasonal and perennial allergic rhinoconjunctivitis Past history - Perennial rhinitis symptoms for the past 5 years with worsening in the summer. Patient was followed by ENT in [facility] and has been on weekly allergy immunotherapy with some benefit. 2020 skin testing showed: positive to grass, ragweed, weed, trees, mold, dust mites.   Interim history - started and stopped allergy injections due to pregnancy. Increased nasal congestion since stopped nasal spray and Singulair.   Continue environmental control measures.  May use pazeo 1 drop eye drop daily as needed for itchy/watery eyes.  Start Rhinocort  1-2 sprays 1-2 times a day for nasal congestion.   May use over the counter antihistamines such as Zyrtec (cetirizine), Claritin (loratadine), Allegra (fexofenadine), or Xyzal (levocetirizine) daily as needed.  Resume allergy injections once pregnancy is over.   Stop Singulair while pregnant.   Adverse food reaction Past history - Currently avoiding banana, kiwi, mango, avocado, melon as they cause throat closure and lip swelling. 2020 skin testing was negative to: Avocado, banana and watermelon. 2020 bloodwork was positive to: banana, kiwi, and  borderline to avocado. The melons were negative.  Interim history - No accidental  ingestion.   Continue meticulous avoidance of banana, kiwi, mango, avocado, melon and have access to epinephrine autoinjector 2 pack. If interested, we can schedule for mango in office food challenge once done with pregnancy.  For mild symptoms you can take over the counter antihistamines such as Benadryl and monitor symptoms closely. If symptoms worsen or if you have severe symptoms including breathing issues, throat closure, significant swelling, whole body hives, severe diarrhea and vomiting, lightheadedness then inject epinephrine and seek immediate medical care afterwards.  Return in about 3 months (around 09/06/2018).  Meds ordered this encounter  Medications  . budesonide (RHINOCORT AQUA) 32 MCG/ACT nasal spray    Sig: Take 1-2 sprays in each nostril twice a day for nasal congestion.    Dispense:  1 Bottle    Refill:  5   Diagnostics: None.  Medication List:  Current Outpatient Medications  Medication Sig Dispense Refill  . cetirizine (ZYRTEC) 10 MG tablet Take 10 mg by mouth daily.    Marland Kitchen EPINEPHrine (EPIPEN 2-PAK) 0.3 mg/0.3 mL IJ SOAJ injection Inject 0.3 mLs (0.3 mg total) into the muscle once as needed for up to 1 dose. 2 Device 2  . PROAIR HFA 108 (90 BASE) MCG/ACT inhaler Inhale 2 puffs into the lungs every 4 (four) hours as needed for wheezing or shortness of breath.   3  . promethazine (PHENERGAN) 25 MG tablet Take 1 tablet (25 mg total) by mouth every 6 (six) hours as needed for nausea or vomiting. 30 tablet 2  . budesonide (RHINOCORT AQUA) 32 MCG/ACT nasal spray Take 1-2 sprays in each nostril twice a day for nasal congestion. 1 Bottle 5  . fluticasone (FLONASE) 50 MCG/ACT nasal spray Place 2 sprays into both nostrils daily. (Patient not taking: Reported on 06/06/2018) 16 g 5  . montelukast (SINGULAIR) 10 MG tablet Take 1 tablet (10 mg total) by mouth at bedtime. (Patient not taking: Reported on 06/06/2018) 30 tablet 5  . Olopatadine HCl (PAZEO) 0.7 % SOLN Place 1 drop into  both eyes daily. As needed. (Patient not taking: Reported on 06/06/2018) 1 Bottle 5   No current facility-administered medications for this visit.    Allergies: Allergies  Allergen Reactions  . Other Swelling    Melons  . Aspirin     Bleeding colon polyps  . Avocado Swelling  . Banana Swelling  . Food Swelling    Mango,banana, & avacodo   Swelling of the lips  . Kiwi Extract Swelling  . Mangifera Indica Swelling  . Nsaids Other (See Comments)    Polyposis : colon, surgery sch  . Latex Rash   I reviewed her past medical history, social history, family history, and environmental history and no significant changes have been reported from previous visit on 02/02/2018.  Review of Systems  Constitutional: Negative for appetite change, chills, fever and unexpected weight change.  HENT: Positive for congestion. Negative for rhinorrhea.   Eyes: Negative for itching.  Respiratory: Negative for cough, chest tightness, shortness of breath and wheezing.   Cardiovascular: Negative for chest pain.  Gastrointestinal: Positive for nausea. Negative for abdominal pain.  Genitourinary: Negative for difficulty urinating.  Skin: Negative for rash.  Allergic/Immunologic: Positive for environmental allergies and food allergies.  Neurological: Negative for headaches.   Objective: Physical Exam Not obtained as encounter was done via telephone.   Previous notes and tests were reviewed.  I discussed the assessment and treatment plan  with the patient. The patient was provided an opportunity to ask questions and all were answered. The patient agreed with the plan and demonstrated an understanding of the instructions. After visit summary/patient instructions available via mychart.   The patient was advised to call back or seek an in-person evaluation if the symptoms worsen or if the condition fails to improve as anticipated.  I provided 17 minutes of non-face-to-face time during this encounter.  It was  my pleasure to participate in [place] care today. Please feel free to contact me with any questions or concerns.   Sincerely,  Rexene Alberts, DO Allergy & Immunology  [facility] office: [phone] [facility] office: [phone]

## 2018-06-06 NOTE — Patient Instructions (Addendum)
Mild intermittent asthma without complication Well-controlled.   Daily controller medication(s):NONE  Prior to physical activity:May use albuterol rescue inhaler 2 puffs 5 to 15 minutes prior to strenuous physical activities.  Rescue medications:May use albuterol rescue inhaler 2 puffs or nebulizer every 4 to 6 hours as needed for shortness of breath, chest tightness, coughing, and wheezing. Monitor frequency of use.   Seasonal and perennial allergic rhinoconjunctivitis Past history - 2020 skin testing showed: positive to grass, ragweed, weed, trees, mold, dust mites.    Continue environmental control measures.  May use pazeo 1 drop eye drop daily as needed for itchy/watery eyes.  Start Rhinocort  1-2 sprays 1-2 times a day for nasal congestion.   May use over the counter antihistamines such as Zyrtec (cetirizine), Claritin (loratadine), Allegra (fexofenadine), or Xyzal (levocetirizine) daily as needed.  Nasal saline spray (i.e., Simply Saline) or nasal saline lavage (i.e., NeilMed) is recommended as needed and prior to medicated nasal sprays.  Resume allergy injections once pregnancy is over.   Stop Singulair while pregnant.    Adverse food reaction Past history - 2020 skin testing was negative to: Avocado, banana and watermelon. 2020 bloodwork was positive to: banana, kiwi, and borderline to avocado. The melons were negative.   Continue meticulous avoidance of banana, kiwi, mango, avocado, melon and have access to epinephrine autoinjector 2 pack. If interested, we can schedule for mango in office food challenge once done with pregnancy.  For mild symptoms you can take over the counter antihistamines such as Benadryl and monitor symptoms closely. If symptoms worsen or if you have severe symptoms including breathing issues, throat closure, significant swelling, whole body hives, severe diarrhea and vomiting, lightheadedness then inject epinephrine and seek immediate medical care  afterwards.  Follow up in 3 months  Reducing Pollen Exposure . Pollen seasons: trees (spring), grass (summer) and ragweed/weeds (fall). Marland Kitchen Keep windows closed in your home and car to lower pollen exposure.  Susa Simmonds air conditioning in the bedroom and throughout the house if possible.  . Avoid going out in dry windy days - especially early morning. . Pollen counts are highest between 5 - 10 AM and on dry, hot and windy days.  . Save outside activities for late afternoon or after a heavy rain, when pollen levels are lower.  . Avoid mowing of grass if you have grass pollen allergy. Marland Kitchen Be aware that pollen can also be transported indoors on people and pets.  . Dry your clothes in an automatic dryer rather than hanging them outside where they might collect pollen.  . Rinse hair and eyes before bedtime. Control of House Dust Mite Allergen . Dust mite allergens are a common trigger of allergy and asthma symptoms. While they can be found throughout the house, these microscopic creatures thrive in warm, humid environments such as bedding, upholstered furniture and carpeting. . Because so much time is spent in the bedroom, it is essential to reduce mite levels there.  . Encase pillows, mattresses, and box springs in special allergen-proof fabric covers or airtight, zippered plastic covers.  . Bedding should be washed weekly in hot water (130 F) and dried in a hot dryer. Allergen-proof covers are available for comforters and pillows that can't be regularly washed.  Wendee Copp the allergy-proof covers every few months. Minimize clutter in the bedroom. Keep pets out of the bedroom.  Marland Kitchen Keep humidity less than 50% by using a dehumidifier or air conditioning. You can buy a humidity measuring device called a hygrometer to  monitor this.  . If possible, replace carpets with hardwood, linoleum, or washable area rugs. If that's not possible, vacuum frequently with a vacuum that has a HEPA filter. . Remove all  upholstered furniture and non-washable window drapes from the bedroom. . Remove all non-washable stuffed toys from the bedroom.  Wash stuffed toys weekly. Mold Control . Mold and fungi can grow on a variety of surfaces provided certain temperature and moisture conditions exist.  . Outdoor molds grow on plants, decaying vegetation and soil. The major outdoor mold, Alternaria and Cladosporium, are found in very high numbers during hot and dry conditions. Generally, a late summer - fall peak is seen for common outdoor fungal spores. Rain will temporarily lower outdoor mold spore count, but counts rise rapidly when the rainy period ends. . The most important indoor molds are Aspergillus and Penicillium. Dark, humid and poorly ventilated basements are ideal sites for mold growth. The next most common sites of mold growth are the bathroom and the kitchen. Outdoor (Seasonal) Mold Control . Use air conditioning and keep windows closed. . Avoid exposure to decaying vegetation. Marland Kitchen Avoid leaf raking. . Avoid grain handling. . Consider wearing a face mask if working in moldy areas.  Indoor (Perennial) Mold Control  . Maintain humidity below 50%. . Get rid of mold growth on hard surfaces with water, detergent and, if necessary, 5% bleach (do not mix with other cleaners). Then dry the area completely. If mold covers an area more than 10 square feet, consider hiring an indoor environmental professional. . For clothing, washing with soap and water is best. If moldy items cannot be cleaned and dried, throw them away. . Remove sources e.g. contaminated carpets. . Repair and seal leaking roofs or pipes. Using dehumidifiers in damp basements may be helpful, but empty the water and clean units regularly to prevent mildew from forming. All rooms, especially basements, bathrooms and kitchens, require ventilation and cleaning to deter mold and mildew growth. Avoid carpeting on concrete or damp floors, and storing items in  damp areas.

## 2018-06-06 NOTE — Progress Notes (Signed)
Start time:  [place] Time:  1010 Where are you located:  home Do you give [place] permission to bill your insurance:  yes Are you signed up for my chart:  No, sent link

## 2018-06-06 NOTE — Assessment & Plan Note (Signed)
Well-controlled.   Daily controller medication(s):NONE  Prior to physical activity:May use albuterol rescue inhaler 2 puffs 5 to 15 minutes prior to strenuous physical activities.  Rescue medications:May use albuterol rescue inhaler 2 puffs or nebulizer every 4 to 6 hours as needed for shortness of breath, chest tightness, coughing, and wheezing. Monitor frequency of use.

## 2018-06-06 NOTE — Assessment & Plan Note (Signed)
Past history - Perennial rhinitis symptoms for the past 5 years with worsening in the summer. Patient was followed by ENT in [facility] and has been on weekly allergy immunotherapy with some benefit. 2020 skin testing showed: positive to grass, ragweed, weed, trees, mold, dust mites.   Interim history - started and stopped allergy injections due to pregnancy. Increased nasal congestion since stopped nasal spray and Singulair.   Continue environmental control measures.  May use pazeo 1 drop eye drop daily as needed for itchy/watery eyes.  Start Rhinocort  1-2 sprays 1-2 times a day for nasal congestion.   May use over the counter antihistamines such as Zyrtec (cetirizine), Claritin (loratadine), Allegra (fexofenadine), or Xyzal (levocetirizine) daily as needed.  Resume allergy injections once pregnancy is over.   Stop Singulair while pregnant.

## 2018-06-08 ENCOUNTER — Telehealth: Payer: Self-pay | Admitting: Allergy

## 2018-06-08 ENCOUNTER — Other Ambulatory Visit: Payer: Self-pay | Admitting: *Deleted

## 2018-06-08 NOTE — Telephone Encounter (Signed)
Patient was calling about medication - did not specify - needing a prior auth because her insurance will not cover it Please call

## 2018-06-08 NOTE — Telephone Encounter (Signed)
Called and spoke with the patient. Patient stated that insurance will not cover Rhinocort Aqua. Since patient is pregnant a PA has been faxed to [facility] for Rhinocort. Currently awaiting approval or denial.

## 2018-06-09 NOTE — Telephone Encounter (Signed)
PA was denied. Will call [place] [employer] to try and get approval.

## 2018-06-09 NOTE — Telephone Encounter (Signed)
Per Levada Dy at [place] will not be covered as it in not listed on the preferred or non-preferred medication list. Patient will have to buy medication over the counter.

## 2018-06-09 NOTE — Telephone Encounter (Signed)
Patient informed that she will need to get Rhinocort over the counter and she said that was fine.

## 2018-06-09 NOTE — Telephone Encounter (Signed)
Left message for patient to call office.  

## 2018-08-02 ENCOUNTER — Other Ambulatory Visit: Payer: Self-pay | Admitting: *Deleted

## 2018-08-02 DIAGNOSIS — Z20822 Contact with and (suspected) exposure to covid-19: Secondary | ICD-10-CM

## 2018-08-02 NOTE — Progress Notes (Signed)
lab

## 2018-08-06 ENCOUNTER — Encounter: Payer: Self-pay | Admitting: Internal Medicine

## 2018-08-07 LAB — NOVEL CORONAVIRUS, NAA: SARS-CoV-2, NAA: NOT DETECTED

## 2018-09-07 ENCOUNTER — Ambulatory Visit: Payer: Medicaid Other | Admitting: Allergy

## 2018-11-10 LAB — OB RESULTS CONSOLE GBS: GBS: POSITIVE

## 2018-12-06 ENCOUNTER — Encounter (HOSPITAL_COMMUNITY): Payer: Self-pay | Admitting: *Deleted

## 2018-12-06 ENCOUNTER — Telehealth (HOSPITAL_COMMUNITY): Payer: Self-pay | Admitting: *Deleted

## 2018-12-06 NOTE — Telephone Encounter (Signed)
Preadmission screen  

## 2018-12-08 ENCOUNTER — Other Ambulatory Visit: Payer: Self-pay

## 2018-12-08 ENCOUNTER — Inpatient Hospital Stay (HOSPITAL_COMMUNITY)
Admission: AD | Admit: 2018-12-08 | Discharge: 2018-12-10 | DRG: 807 | Disposition: A | Discharge status: Home / Self Care | Payer: Medicaid Other | Attending: Obstetrics and Gynecology | Admitting: Obstetrics and Gynecology

## 2018-12-08 DIAGNOSIS — O99824 Streptococcus B carrier state complicating childbirth: Principal | ICD-10-CM | POA: Diagnosis present

## 2018-12-08 DIAGNOSIS — Z3A39 39 weeks gestation of pregnancy: Secondary | ICD-10-CM

## 2018-12-08 DIAGNOSIS — O34211 Maternal care for low transverse scar from previous cesarean delivery: Secondary | ICD-10-CM | POA: Diagnosis present

## 2018-12-08 DIAGNOSIS — Z23 Encounter for immunization: Secondary | ICD-10-CM

## 2018-12-08 DIAGNOSIS — Z3689 Encounter for other specified antenatal screening: Secondary | ICD-10-CM

## 2018-12-08 DIAGNOSIS — O34219 Maternal care for unspecified type scar from previous cesarean delivery: Secondary | ICD-10-CM | POA: Diagnosis present

## 2018-12-08 DIAGNOSIS — Z20828 Contact with and (suspected) exposure to other viral communicable diseases: Secondary | ICD-10-CM | POA: Diagnosis present

## 2018-12-08 NOTE — MAU Note (Signed)
Pt reports intermittent back pain through out the day. Pt has also reports some abdominal tightening.   Reports some bloody show,   Denies LOF.   Reports +FM

## 2018-12-09 ENCOUNTER — Encounter (HOSPITAL_COMMUNITY): Payer: Medicaid Other

## 2018-12-09 ENCOUNTER — Encounter (HOSPITAL_COMMUNITY): Payer: Self-pay | Admitting: Advanced Practice Midwife

## 2018-12-09 DIAGNOSIS — Z3A39 39 weeks gestation of pregnancy: Secondary | ICD-10-CM | POA: Diagnosis not present

## 2018-12-09 DIAGNOSIS — Z20828 Contact with and (suspected) exposure to other viral communicable diseases: Secondary | ICD-10-CM | POA: Diagnosis present

## 2018-12-09 DIAGNOSIS — O34211 Maternal care for low transverse scar from previous cesarean delivery: Secondary | ICD-10-CM | POA: Diagnosis present

## 2018-12-09 DIAGNOSIS — O99824 Streptococcus B carrier state complicating childbirth: Secondary | ICD-10-CM | POA: Diagnosis present

## 2018-12-09 DIAGNOSIS — O34219 Maternal care for unspecified type scar from previous cesarean delivery: Secondary | ICD-10-CM | POA: Diagnosis present

## 2018-12-09 DIAGNOSIS — O26893 Other specified pregnancy related conditions, third trimester: Secondary | ICD-10-CM | POA: Diagnosis present

## 2018-12-09 DIAGNOSIS — Z23 Encounter for immunization: Secondary | ICD-10-CM | POA: Diagnosis not present

## 2018-12-09 LAB — ABO/RH: ABO/RH(D): A POS

## 2018-12-09 LAB — CBC
HCT: 39.2 % (ref 36.0–46.0)
Hemoglobin: 13.3 g/dL (ref 12.0–15.0)
MCH: 30.5 pg (ref 26.0–34.0)
MCHC: 33.9 g/dL (ref 30.0–36.0)
MCV: 89.9 fL (ref 80.0–100.0)
Platelets: 150 10*3/uL (ref 150–400)
RBC: 4.36 MIL/uL (ref 3.87–5.11)
RDW: 12.9 % (ref 11.5–15.5)
WBC: 9.3 10*3/uL (ref 4.0–10.5)
nRBC: 0 % (ref 0.0–0.2)

## 2018-12-09 LAB — SARS CORONAVIRUS 2 (TAT 6-24 HRS): SARS Coronavirus 2: NEGATIVE

## 2018-12-09 LAB — TYPE AND SCREEN
ABO/RH(D): A POS
Antibody Screen: NEGATIVE

## 2018-12-09 LAB — RPR: RPR Ser Ql: NONREACTIVE

## 2018-12-09 MED ORDER — WITCH HAZEL-GLYCERIN EX PADS: 1.0000 "application " | MEDICATED_PAD | CUTANEOUS | Status: DC | PRN

## 2018-12-09 MED ORDER — LIDOCAINE HCL (PF) 1 % IJ SOLN
30.0000 mL | INTRAMUSCULAR | Status: AC | PRN
  Administered 2018-12-09: 30 mL via SUBCUTANEOUS
  Filled 2018-12-09: qty 30

## 2018-12-09 MED ORDER — TETANUS-DIPHTH-ACELL PERTUSSIS 5-2.5-18.5 LF-MCG/0.5 IM SUSP: 0.5000 mL | Freq: Once | INTRAMUSCULAR | Status: DC

## 2018-12-09 MED ORDER — SODIUM CHLORIDE 0.9 % IV SOLN
2.0000 g | Freq: Four times a day (QID) | INTRAVENOUS | Status: DC
  Administered 2018-12-09: 2 g via INTRAVENOUS
  Filled 2018-12-09 (×3): qty 2000

## 2018-12-09 MED ORDER — ONDANSETRON HCL 4 MG/2ML IJ SOLN: 4.0000 mg | Freq: Four times a day (QID) | INTRAMUSCULAR | Status: DC | PRN

## 2018-12-09 MED ORDER — ONDANSETRON HCL 4 MG/2ML IJ SOLN: 4.0000 mg | INTRAMUSCULAR | Status: DC | PRN

## 2018-12-09 MED ORDER — LACTATED RINGERS IV SOLN: 500.0000 mL | Freq: Once | INTRAVENOUS | Status: DC

## 2018-12-09 MED ORDER — PHENYLEPHRINE 40 MCG/ML (10ML) SYRINGE FOR IV PUSH (FOR BLOOD PRESSURE SUPPORT): 80.0000 ug | PREFILLED_SYRINGE | INTRAVENOUS | Status: DC | PRN

## 2018-12-09 MED ORDER — ACETAMINOPHEN 325 MG PO TABS: 650.0000 mg | ORAL_TABLET | ORAL | Status: DC | PRN

## 2018-12-09 MED ORDER — LACTATED RINGERS IV SOLN
INTRAVENOUS | Status: DC
  Administered 2018-12-09: 02:00:00 via INTRAVENOUS

## 2018-12-09 MED ORDER — BUTORPHANOL TARTRATE 1 MG/ML IJ SOLN: 1.0000 mg | INTRAMUSCULAR | Status: DC | PRN

## 2018-12-09 MED ORDER — LACTATED RINGERS IV SOLN: 500.0000 mL | INTRAVENOUS | Status: DC | PRN

## 2018-12-09 MED ORDER — SODIUM CHLORIDE 0.9 % IV SOLN
2.0000 g | Freq: Once | INTRAVENOUS | Status: AC
  Administered 2018-12-09: 2 g via INTRAVENOUS
  Filled 2018-12-09: qty 2000

## 2018-12-09 MED ORDER — EPHEDRINE 5 MG/ML INJ: 10.0000 mg | INTRAVENOUS | Status: DC | PRN

## 2018-12-09 MED ORDER — SOD CITRATE-CITRIC ACID 500-334 MG/5ML PO SOLN: 30.0000 mL | ORAL | Status: DC | PRN

## 2018-12-09 MED ORDER — LACTATED RINGERS IV SOLN: INTRAVENOUS | Status: DC

## 2018-12-09 MED ORDER — FENTANYL-BUPIVACAINE-NACL 0.5-0.125-0.9 MG/250ML-% EP SOLN: 12.0000 mL/h | EPIDURAL | Status: DC | PRN

## 2018-12-09 MED ORDER — ONDANSETRON HCL 4 MG PO TABS: 4.0000 mg | ORAL_TABLET | ORAL | Status: DC | PRN

## 2018-12-09 MED ORDER — ALBUTEROL SULFATE (2.5 MG/3ML) 0.083% IN NEBU: 3.0000 mL | INHALATION_SOLUTION | RESPIRATORY_TRACT | Status: DC | PRN

## 2018-12-09 MED ORDER — SIMETHICONE 80 MG PO CHEW: 80.0000 mg | CHEWABLE_TABLET | ORAL | Status: DC | PRN

## 2018-12-09 MED ORDER — SENNOSIDES-DOCUSATE SODIUM 8.6-50 MG PO TABS
2.0000 | ORAL_TABLET | ORAL | Status: DC
  Administered 2018-12-10: 2 via ORAL
  Filled 2018-12-09: qty 2

## 2018-12-09 MED ORDER — OXYCODONE HCL 5 MG PO TABS
5.0000 mg | ORAL_TABLET | ORAL | Status: DC | PRN
  Administered 2018-12-10: 5 mg via ORAL
  Filled 2018-12-09: qty 1

## 2018-12-09 MED ORDER — LORATADINE 10 MG PO TABS: 10.0000 mg | ORAL_TABLET | Freq: Every day | ORAL | Status: DC

## 2018-12-09 MED ORDER — DIPHENHYDRAMINE HCL 50 MG/ML IJ SOLN: 12.5000 mg | INTRAMUSCULAR | Status: DC | PRN

## 2018-12-09 MED ORDER — DIPHENHYDRAMINE HCL 25 MG PO CAPS: 25.0000 mg | ORAL_CAPSULE | Freq: Four times a day (QID) | ORAL | Status: DC | PRN

## 2018-12-09 MED ORDER — BENZOCAINE-MENTHOL 20-0.5 % EX AERO
1.0000 "application " | INHALATION_SPRAY | CUTANEOUS | Status: DC | PRN
  Administered 2018-12-09: 1 via TOPICAL
  Filled 2018-12-09: qty 56

## 2018-12-09 MED ORDER — PROMETHAZINE HCL 25 MG PO TABS: 25.0000 mg | ORAL_TABLET | Freq: Four times a day (QID) | ORAL | Status: DC | PRN

## 2018-12-09 MED ORDER — TERBUTALINE SULFATE 1 MG/ML IJ SOLN: 0.2500 mg | Freq: Once | INTRAMUSCULAR | Status: DC | PRN

## 2018-12-09 MED ORDER — ACETAMINOPHEN 325 MG PO TABS
650.0000 mg | ORAL_TABLET | ORAL | Status: DC | PRN
  Administered 2018-12-09 (×2): 650 mg via ORAL
  Filled 2018-12-09 (×2): qty 2

## 2018-12-09 MED ORDER — ZOLPIDEM TARTRATE 5 MG PO TABS: 5.0000 mg | ORAL_TABLET | Freq: Every evening | ORAL | Status: DC | PRN

## 2018-12-09 MED ORDER — PRENATAL MULTIVITAMIN CH
1.0000 | ORAL_TABLET | Freq: Every day | ORAL | Status: DC
  Administered 2018-12-09 – 2018-12-10 (×2): 1 via ORAL
  Filled 2018-12-09 (×2): qty 1

## 2018-12-09 MED ORDER — OXYTOCIN 40 UNITS IN NORMAL SALINE INFUSION - SIMPLE MED
1.0000 m[IU]/min | INTRAVENOUS | Status: DC
  Administered 2018-12-09: 1 m[IU]/min via INTRAVENOUS

## 2018-12-09 MED ORDER — COCONUT OIL OIL: 1.0000 "application " | TOPICAL_OIL | Status: DC | PRN

## 2018-12-09 MED ORDER — INFLUENZA VAC SPLIT QUAD 0.5 ML IM SUSY
0.5000 mL | PREFILLED_SYRINGE | INTRAMUSCULAR | Status: AC
  Administered 2018-12-10: 0.5 mL via INTRAMUSCULAR
  Filled 2018-12-09: qty 0.5

## 2018-12-09 MED ORDER — OXYCODONE-ACETAMINOPHEN 5-325 MG PO TABS: 2.0000 | ORAL_TABLET | ORAL | Status: DC | PRN

## 2018-12-09 MED ORDER — SODIUM CHLORIDE 0.9 % IV SOLN: 1.0000 g | Freq: Four times a day (QID) | INTRAVENOUS | Status: DC

## 2018-12-09 MED ORDER — OXYTOCIN BOLUS FROM INFUSION
500.0000 mL | Freq: Once | INTRAVENOUS | Status: AC
  Administered 2018-12-09: 09:00:00 500 mL via INTRAVENOUS

## 2018-12-09 MED ORDER — DIBUCAINE (PERIANAL) 1 % EX OINT: 1.0000 "application " | TOPICAL_OINTMENT | CUTANEOUS | Status: DC | PRN

## 2018-12-09 MED ORDER — SODIUM CHLORIDE 0.9 % IV SOLN
2.0000 g | INTRAVENOUS | Status: AC
  Administered 2018-12-09: 2 g via INTRAVENOUS
  Filled 2018-12-09: qty 2

## 2018-12-09 MED ORDER — OXYCODONE-ACETAMINOPHEN 5-325 MG PO TABS: 1.0000 | ORAL_TABLET | ORAL | Status: DC | PRN

## 2018-12-09 MED ORDER — OXYTOCIN 40 UNITS IN NORMAL SALINE INFUSION - SIMPLE MED
2.5000 [IU]/h | INTRAVENOUS | Status: DC
  Filled 2018-12-09: qty 1000

## 2018-12-09 NOTE — Lactation Note (Addendum)
This note was copied from a baby's chart. Lactation Consultation Note  Patient Name: Andrea Lang [id number] Date: 12/09/2018 Reason for consult: Mother's request;Difficult latch P3, 14 hour female infant. Mom requested [place] services due to difficulties with latching infant at breast. LC entered the room mom was attempting to latch infant without pillow support, she bent over in bed. Mom was opened to [facility] suggestion, [place] gave mom pillows for support, mom latched infant on her right breast using the football hold. [place] asked mom to hand expression a small amount of colostrum out, prior to latching infant, mom tickled infant below nose with nipple. Infant open mouth wide, tongue down and mom brought infant to breast chin first with nose and chin touching breast. Infant sustained latch and had rthymitic sucking pattern and was still breastfeeding after 5 minutes when [place] left room. Per mom, she felt a tug but not any pain with latch.  LC discussed with mom to use pillow support while breastfeeding.  Mom knows she can ask Nurse or [place] for latch assistance if needed.  Maternal Data    Feeding    LATCH Score Latch: Grasps breast easily, tongue down, lips flanged, rhythmical sucking.  Audible Swallowing: Spontaneous and intermittent  Type of Nipple: Everted at rest and after stimulation  Comfort (Breast/Nipple): Soft / non-tender  Hold (Positioning): Assistance needed to correctly position infant at breast and maintain latch.  LATCH Score: 9  Interventions Interventions: Support pillows;Position options;Adjust position;Skin to skin;Assisted with latch  Lactation Tools Discussed/Used     Consult Status Consult Status: Follow-up Date: 12/10/18 Follow-up type: In-patient    Vicente Serene 12/09/2018, 11:26 PM

## 2018-12-09 NOTE — Discharge Instructions (Signed)

## 2018-12-09 NOTE — H&P (Signed)
Andrea Lang is a 28 y.o. female, G5 P21, EGA [redacted] weeks with EDC 11-13 presenting for ctx.  On eval in MAU, VE changed from 4-5 cm with irreg ctx.  PNC complicated by previous LTCS and VBAC, wants VBAC again.  OB History    Gravida  5   Para  2   Term  2   Preterm      AB  2   Living  2     SAB  2   TAB      Ectopic      Multiple      Live Births  2          Past Medical History:  Diagnosis Date  . Allergy   . Asthma    no real dx; but requires use of an inhaler for occassional SOB, more with pregnancy  . GERD (gastroesophageal reflux disease)    OCCASIONAL   . Seasonal allergies   . Sinusitis   . 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 CESAREAN SECTION  2009  . COLONOSCOPY    . LAPAROSCOPIC PARTIAL COLECTOMY N/A 06/20/2014   Procedure: LAPAROSCOPIC TOTAL ABDOMINAL  COLECTOMY;  Surgeon: [name], MD;  Location: [facility];  Service: General;  Laterality: N/A;  . POLYPECTOMY    . TONSILLECTOMY     Family History: family history is not on file. Social History:  reports that she has never smoked. She has never used smokeless tobacco. She reports that she does not drink alcohol or use drugs.     Maternal Diabetes: No Genetic Screening: Declined Maternal Ultrasounds/Referrals: Normal Fetal Ultrasounds or other Referrals:  None Maternal Substance Abuse:  No Significant Maternal Medications:  None Significant Maternal Lab Results:  Group B Strep positive Other Comments:  None  Review of Systems  Respiratory: Negative.   Cardiovascular: Negative.    Maternal Medical History:  Reason for admission: Contractions.   Contractions: Frequency: irregular.   Perceived severity is moderate.    Fetal activity: Perceived fetal activity is normal.    Prenatal complications: no prenatal complications Prenatal Complications - Diabetes: none.   AROM  clear Dilation: 6 Effacement (%): 60 Station: -1 Exam by:: Dr. Willis Modena Blood pressure 127/65, pulse 65, temperature 97.6 F (36.4 C), temperature source Oral, resp. rate 17, height 5\' 5"  (1.651 m), weight 102.2 kg, last menstrual period 11/21/2017, SpO2 100 %, unknown if currently breastfeeding. Maternal Exam:  Uterine Assessment: Contraction strength is moderate.  Contraction frequency is irregular.   Abdomen: Patient reports no abdominal tenderness. Surgical scars: low transverse.   Estimated fetal weight is 8-9 lbs.   Fetal presentation: vertex  Introitus: Normal vulva. Normal vagina.  Amniotic fluid character: clear.  Pelvis: adequate for delivery.   Cervix: Cervix evaluated by digital exam.     Fetal Exam Fetal Monitor Review: Mode: ultrasound.   Baseline rate: 120s.  Variability: moderate (6-25 bpm).   Pattern: accelerations present and no decelerations.    Fetal State Assessment: Category I - tracings are normal.     Physical Exam  Vitals reviewed. Constitutional: She appears well-developed and well-nourished.  Cardiovascular: Normal rate and regular rhythm.  Respiratory: Effort normal. No respiratory distress.  GI: Soft.  Genitourinary:    Vulva normal.     Prenatal labs: ABO, Rh: --/--/A POS, A POS Performed at [facility], [place] [address]  [place], [place] [address]  [phone]) Antibody: NEG (11/13 0205) Rubella: Immune (04/23 0000) RPR: Nonreactive (04/23 0000)  HBsAg: Negative (04/23 0000)  HIV: Non-reactive (04/23 0000)  GBS: Positive/-- (10/15 0000)   Assessment/Plan: IUP at 39 weeks admitted in latent labor, previous LTCS and VBAC, GBS+.  She has received one dose of ampicillin, AROM done for augmentation-also on 2 mu/min pitocin, monitor progress, anticipate VBAC   Blane Ohara Iya Hamed 12/09/2018, 7:16 AM

## 2018-12-09 NOTE — MAU Note (Signed)
Covid swab obtained without difficulty and pt tol well. No symptoms 

## 2018-12-09 NOTE — Progress Notes (Signed)
Patient ID: Andrea Lang, female   DOB: 1991-01-18, 28 y.o.   MRN: [id number]   Pt with rapid cervical change with increased pressure.  Large anterior lip.  Unable to reduce despite several tries.    AFVSS gen uncomfortable FHTs 120-130, mod var, + accels toco q 2-3 min  SVD 9/100/0-+1  Will attempt to push ASAP, turned her on side D/w pt

## 2018-12-09 NOTE — Lactation Note (Signed)
This note was copied from a baby's chart. Lactation Consultation Note  Patient Name: Andrea Lang [id number] Date: 12/09/2018 Reason for consult: Initial assessment;Term  1625 - 1649 - I conducted an initial lactation consult with Ms. Andrea Lang. She reports that her 59 hour old daughter, Andrea Lang, latched in recovery, and she has latched one more time since then, around 1330.   Ms. Andrea Lang had positive breast changes in pregnancy. She breast fed her second child for 9 months and had ample milk supply. She does not currently have a breast pump, and I offered to assist her with obtaining one if she requests.  I showed Ms. Andrea Lang how to hand express. She is able to repeat back, and we noted colostrum.  Pediatrician entered the room to do an assessment. I offered to assist with latching baby. We attempted after the assessment to latch baby in cross cradle hold. Baby was too sleepy to latch even with gentle "pestering."   I recommended attempting to wake and feed again in about an hour, and I recommended that she call lactation later tonight if baby is still sleepy and not breast feeding.   We discussed normal infant feeding patterns for day 1, and I educated on feeding cues, and output expectations for day 1. I provided our lactation brochure and discussed support resources.  The feeding plan at this time is to breast feed on demand 8-12 times a day, waking baby to feed as needed, and to call lactation for support as needed.  Maternal Data Formula Feeding for Exclusion: No Has patient been taught Hand Expression?: Yes Does the patient have breastfeeding experience prior to this delivery?: Yes  Feeding Feeding Type: Breast Fed  LATCH Score Latch: Too sleepy or reluctant, no latch achieved, no sucking elicited.  Audible Swallowing: None  Type of Nipple: Everted at rest and after stimulation  Comfort (Breast/Nipple): Soft / non-tender  Hold (Positioning): Assistance needed to correctly position  infant at breast and maintain latch.  LATCH Score: 5  Interventions Interventions: Breast feeding basics reviewed;Assisted with latch;Skin to skin;Hand express;Breast compression  Lactation Tools Discussed/Used     Consult Status Consult Status: Follow-up Date: 12/10/18 Follow-up type: In-patient    Lenore Manner 12/09/2018, 4:56 PM

## 2018-12-10 ENCOUNTER — Other Ambulatory Visit (HOSPITAL_COMMUNITY): Payer: Medicaid Other | Attending: Obstetrics and Gynecology

## 2018-12-10 LAB — CBC
HCT: 34.6 % — ABNORMAL LOW (ref 36.0–46.0)
Hemoglobin: 11.8 g/dL — ABNORMAL LOW (ref 12.0–15.0)
MCH: 30.5 pg (ref 26.0–34.0)
MCHC: 34.1 g/dL (ref 30.0–36.0)
MCV: 89.4 fL (ref 80.0–100.0)
Platelets: 124 10*3/uL — ABNORMAL LOW (ref 150–400)
RBC: 3.87 MIL/uL (ref 3.87–5.11)
RDW: 13 % (ref 11.5–15.5)
WBC: 10.3 10*3/uL (ref 4.0–10.5)
nRBC: 0 % (ref 0.0–0.2)

## 2018-12-10 MED ORDER — PRENATAL VITAMIN 27-0.8 MG PO TABS: 1.0000 | ORAL_TABLET | Freq: Every morning | ORAL | 3 refills | Status: DC

## 2018-12-10 NOTE — Lactation Note (Signed)
This note was copied from a baby's chart. Lactation Consultation Note  Patient Name: Andrea Lang [id number] Date: 12/10/2018   Infant is 23 hrs old. Mom says it is not as easy to get infant to latch on R breast as the L breast. I offered to help. We put infant to the breast (while skin-to-skin with Mom), but infant was not interested. I explained to Mom how to call out for me to return when infant starts cueing.   Mom has not been recording feedings & output.   Matthias Hughs [facility] 12/10/2018, 10:49 AM

## 2018-12-10 NOTE — Lactation Note (Signed)
This note was copied from a baby's chart. Lactation Consultation Note  Patient Name: Andrea Lang [id number] Date: 12/10/2018    P3 mother whose infant is now 7 hours old.    Previous LC called and requested latch assistance.  Baby was swaddled and not showing feeding cues when I arrived.  Offered to assist with latching and mother agreeable.  Had mother remove swaddle and check diaper; infant dry.  Demonstrated gentle stimulation to arouse baby.  She began cuing and I assisted mother to latch to the right breast in the cross cradle hold without difficulty.  However, once at the breast, baby was not interested in sucking more than a few sucks before becoming agitated and restless.  Suggested burping and baby burped well.  Again, latched to the right breast but baby would not feed.  Mother attempted the left breast, which is usually her preferred side, but baby was still not interested in latching and feeding.  Had mother hand express colostrum drops which she fed back to baby.  Continued trying to burp infant but she remains a little bit unsettled.  Placed her STS on mother's chest to calm.  Mother's technique for latching is quite good; only made a few minor adjustments to her latching skills.  Mother does not have a DEBP for home use.  She is not a [facility] participant nor does she have private insurance.  Informed her of the gift shop rental program if desired.  Manual pump provided with instructions for use.  Engorgement prevention/treatment reviewed.    Mother is currently waiting for father to arrive for discharge.  She will call as needed for more assistance.   Maternal Data    Feeding Feeding Type: Breast Milk  LATCH Score Latch: Too sleepy or reluctant, no latch achieved, no sucking elicited.  Audible Swallowing: None  Type of Nipple: Everted at rest and after stimulation  Comfort (Breast/Nipple): Soft / non-tender  Hold (Positioning): Assistance needed to correctly position  infant at breast and maintain latch.  LATCH Score: 5  Interventions    Lactation Tools Discussed/Used     Consult Status Consult Status: Complete Date: 12/10/18 Follow-up type: Call as needed    Keirstyn Aydt R Hazelene Doten 12/10/2018, 1:07 PM

## 2018-12-10 NOTE — Progress Notes (Signed)
Post Partum Day 1 Subjective: no complaints, up ad lib, voiding, tolerating PO and nl lochia, pain controlled  Objective: Blood pressure 124/73, pulse 75, temperature 98.8 F (37.1 C), temperature source Oral, resp. rate 18, height 5\' 5"  (1.651 m), weight 102.2 kg, last menstrual period 11/21/2017, SpO2 100 %, unknown if currently breastfeeding.  Physical Exam:  General: alert and no distress Lochia: appropriate Uterine Fundus: firm   Recent Labs    12/09/18 0141 12/10/18 0422  HGB 13.3 11.8*  HCT 39.2 34.6*    Assessment/Plan: Plan for discharge tomorrow, Breastfeeding and Lactation consult.  Routine PP care.    Pt desires d/c to home if OK w peds.  D/c with motrin and PNV,  F/u 6 weeks   LOS: 1 day   Wallis Vancott Bovard-Stuckert 12/10/2018, 6:09 AM

## 2018-12-10 NOTE — Discharge Summary (Addendum)
Obstetric Discharge Summary Reason for Admission: onset of labor Prenatal Procedures: none Intrapartum Procedures: spontaneous vaginal delivery, GBS prophylaxis and VBAC, manual extraction of placenta Postpartum Procedures: none Complications-Operative and Postpartum: periurethral laceration Hemoglobin  Date Value Ref Range Status  12/10/2018 11.8 (L) 12.0 - 15.0 g/dL Final   HCT  Date Value Ref Range Status  12/10/2018 34.6 (L) 36.0 - 46.0 % Final    Physical Exam:  General: alert and no distress Lochia: appropriate Uterine Fundus: firm   Discharge Diagnoses: Term Pregnancy-delivered  Discharge Information: Date: 12/10/2018 Activity: pelvic rest Diet: routine Medications: PNV Condition: stable Instructions: refer to practice specific booklet Discharge to: home Follow-up Information    Bovard-Stuckert, Shriyan Arakawa, MD. Schedule an appointment as soon as possible for a visit in 6 week(s).   Specialty: Obstetrics and Gynecology Why: for postpartum check Contact information: [place] [address] [phone]           Newborn Data: Live born female  Birth Weight: 8 lb 15.2 oz (4060 g) APGAR: 45, 9  Newborn Delivery   Birth date/time: 12/09/2018 08:52:00 Delivery type: VBAC, Spontaneous      Home with mother.  Lora Chavers Bovard-Stuckert 12/10/2018, 6:59 AM

## 2018-12-12 ENCOUNTER — Inpatient Hospital Stay (HOSPITAL_COMMUNITY)
Admission: AD | Admit: 2018-12-12 | Payer: Medicaid Other | Source: Home / Self Care | Admitting: Obstetrics and Gynecology

## 2018-12-12 ENCOUNTER — Inpatient Hospital Stay (HOSPITAL_COMMUNITY): Payer: Medicaid Other

## 2018-12-20 NOTE — Lactation Note (Signed)
Lactation Consultation Note  Patient Name: Andrea Lang [id number] Date: 12/20/2018  Mom of 9 day old breastfeeding baby called with c/o of pain and firmness in left breast.  She has been on an antibiotic for 4 days with no improvement.  Mom taking tylenol so no fever but feeling hot and cold with chills.  Mom states she feels a painful lump in breast and gets no relief with feeding.  She was pumping but was worried it would make things worse.  She has been talking with [facility] for support. I strongly advised mom to be seen by her MD today.  She will call us back for further support if needed.   Maternal Data    Feeding    LATCH Score                   Interventions    Lactation Tools Discussed/Used     Consult Status      Ave Filter 12/20/2018, 2:00 PM

## 2019-05-31 ENCOUNTER — Other Ambulatory Visit: Payer: Self-pay | Admitting: Allergy

## 2019-10-03 ENCOUNTER — Ambulatory Visit: Payer: Medicaid Other | Admitting: Psychology

## 2019-10-11 ENCOUNTER — Encounter (HOSPITAL_COMMUNITY): Payer: Self-pay | Admitting: Psychiatry

## 2019-10-11 ENCOUNTER — Other Ambulatory Visit: Payer: Self-pay

## 2019-10-11 ENCOUNTER — Ambulatory Visit (INDEPENDENT_AMBULATORY_CARE_PROVIDER_SITE_OTHER): Payer: Medicaid Other | Admitting: Psychiatry

## 2019-10-11 DIAGNOSIS — F330 Major depressive disorder, recurrent, mild: Secondary | ICD-10-CM | POA: Diagnosis not present

## 2019-10-11 DIAGNOSIS — F411 Generalized anxiety disorder: Secondary | ICD-10-CM | POA: Insufficient documentation

## 2019-10-11 MED ORDER — SERTRALINE HCL 100 MG PO TABS: 100.0000 mg | ORAL_TABLET | Freq: Every day | ORAL | 2 refills | Status: DC

## 2019-10-11 MED ORDER — BUPROPION HCL 75 MG PO TABS: 75.0000 mg | ORAL_TABLET | Freq: Two times a day (BID) | ORAL | 2 refills | Status: DC

## 2019-10-11 NOTE — Progress Notes (Signed)
Psychiatric Initial Adult Assessment   Patient Identification: Andrea Lang MRN:  [id number] Date of Evaluation:  10/11/2019 Referral Source: Family medicine Chief Complaint:  " I have good days and other days or not so good" Visit Diagnosis:    ICD-10-CM   1. Generalized anxiety disorder  F41.1 sertraline (ZOLOFT) 100 MG tablet    buPROPion (WELLBUTRIN) 75 MG tablet    Ambulatory referral to Social Work  2. Mild episode of recurrent major depressive disorder (HCC)  F33.0 sertraline (ZOLOFT) 100 MG tablet    buPROPion (WELLBUTRIN) 75 MG tablet    Ambulatory referral to Social Work    History of Present Illness:   29 year old female seen today for initial psychiatric evaluation.  She was referred to outpatient psychiatry by family medicine for medication management.  She has a psychiatric history of anxiety and depression.  She is currently managed on Zoloft 100 mg daily and Wellbutrin 75 mg twice daily.  She notes that her medications are effective in managing her psychiatric conditions.  Today patient is well-groomed, pleasant, cooperative, engaged in conversation, and maintained eye contact.  She reports that her mood is normal and states that she feels well.  At times she notes she experiences depressed mood, fatigue, feelings of worthlessness, difficulty concentrating, anxiety, decreased energy, and fluctuations in weight however notes that she is able to cope with above symptoms.  Patient informed provider that she has 3 young children who are ages 50 months, 72 years old, and 28 years old.  She notes that life as a mom comes with its challenges however notes that she is overall happy.  Provider asked patient if she had experienced trauma.  Patient became tearful and notes that her father had a separate family that was traumatic for her.  She notes that she is now close to her mother however not as close to her father.  She is married and notes that she has a supportive spouse.  She denies  SI/HI/VH or paranoia.  No medication changes made today.  Patient is agreeable to continue all medications as prescribed.  Provider asked patient if she would like to be referred to outpatient therapy to help deal with past trauma and she notes that she would.  Patient will follow up with outpatient therapist for counselor.  No other concerns noted at this time.  Associated Signs/Symptoms: Depression Symptoms:  depressed mood, fatigue, feelings of worthlessness/guilt, difficulty concentrating, hopelessness, impaired memory, anxiety, loss of energy/fatigue, weight loss, weight gain, (Hypo) Manic Symptoms:  Elevated Mood, Irritable Mood, Anxiety Symptoms:  Excessive Worry, Psychotic Symptoms:  Denies PTSD Symptoms: Had a traumatic exposure:  Notes that her father had a seprate family  Past Psychiatric History: Anxiety and depression   Previous Psychotropic Medications: Notes has only tried Zoloft and Wellbutrin  Substance Abuse History in the last 12 months:  Yes.    Consequences of Substance Abuse: NA  Past Medical History:  Past Medical History:  Diagnosis Date  . Allergy   . Asthma    no real dx; but requires use of an inhaler for occassional SOB, more with pregnancy  . GERD (gastroesophageal reflux disease)    OCCASIONAL   . Seasonal allergies   . Sinusitis   . 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 VBAC, delivered, current hospitalization 12/09/2018    Past Surgical History:  Procedure Laterality Date  . CESAREAN SECTION  2009  . COLONOSCOPY    .  LAPAROSCOPIC PARTIAL COLECTOMY N/A 06/20/2014   Procedure: LAPAROSCOPIC TOTAL ABDOMINAL  COLECTOMY;  Surgeon: [name], MD;  Location: [facility];  Service: General;  Laterality: N/A;  . POLYPECTOMY    . TONSILLECTOMY      Family Psychiatric History: Paternal aunt depression   Family History:  Family History  Problem Relation Age of Onset  . Colon  cancer Neg Hx   . Esophageal cancer Neg Hx   . Rectal cancer Neg Hx   . Stomach cancer Neg Hx   . Colon polyps Neg Hx     Social History:   Social History   Socioeconomic History  . Marital status: Married    Spouse name: Not on file  . Number of children: 2  . Years of education: Not on file  . Highest education level: Not on file  Occupational History  . Occupation: Ship broker  Tobacco Use  . Smoking status: Never Smoker  . Smokeless tobacco: Never Used  Vaping Use  . Vaping Use: Never used  Substance and Sexual Activity  . Alcohol use: No    Alcohol/week: 0.0 standard drinks  . Drug use: No  . Sexual activity: Yes    Birth control/protection: Condom, I.U.D.  Other Topics Concern  . Not on file  Social History Narrative  . Not on file   Social Determinants of Health   Financial Resource Strain:   . Difficulty of Paying Living Expenses: Not on file  Food Insecurity:   . Worried About Charity fundraiser in the Last Year: Not on file  . Ran Out of Food in the Last Year: Not on file  Transportation Needs:   . Lack of Transportation (Medical): Not on file  . Lack of Transportation (Non-Medical): Not on file  Physical Activity:   . Days of Exercise per Week: Not on file  . Minutes of Exercise per Session: Not on file  Stress:   . Feeling of Stress : Not on file  Social Connections:   . Frequency of Communication with Friends and Family: Not on file  . Frequency of Social Gatherings with Friends and Family: Not on file  . Attends Religious Services: Not on file  . Active Member of Clubs or Organizations: Not on file  . Attends Archivist Meetings: Not on file  . Marital Status: Not on file    Additional Social History: Patient is married. She resides in [place]. She has a 59 month year old dayghter and two sons ages 17 and 56. She is a stay at home mother. She denies tobacco, alcohol, or illegal drug use.  Allergies:   Allergies  Allergen Reactions   . Other Swelling    Melons  . Aspirin     Bleeding colon polyps  . Avocado Swelling  . Banana Swelling  . Food Swelling    Mango,banana, & avacodo   Swelling of the lips  . Kiwi Extract Swelling  . Mangifera Indica Swelling  . Nsaids Other (See Comments)    Polyposis : colon, surgery sch  . Latex Rash    Metabolic Disorder Labs: Lab Results  Component Value Date   HGBA1C 5.4 06/14/2014   MPG 108 06/14/2014   No results found for: PROLACTIN No results found for: CHOL, TRIG, HDL, CHOLHDL, VLDL, LDLCALC No results found for: TSH  Therapeutic Level Labs: No results found for: LITHIUM No results found for: CBMZ No results found for: VALPROATE  Current Medications: Current Outpatient Medications  Medication Sig Dispense Refill  .  buPROPion (WELLBUTRIN) 75 MG tablet Take 1 tablet (75 mg total) by mouth 2 (two) times daily. 60 tablet 2  . sertraline (ZOLOFT) 100 MG tablet Take 1 tablet (100 mg total) by mouth daily. 30 tablet 2  . cetirizine (ZYRTEC) 10 MG tablet Take 10 mg by mouth daily.    Marland Kitchen EPINEPHrine (EPIPEN 2-PAK) 0.3 mg/0.3 mL IJ SOAJ injection Inject 0.3 mLs (0.3 mg total) into the muscle once as needed for up to 1 dose. 2 Device 2  . Prenatal Vit-Fe Fumarate-FA (PRENATAL VITAMIN) 27-0.8 MG TABS Take 1 tablet by mouth every morning. 100 tablet 3  . PROAIR HFA 108 (90 BASE) MCG/ACT inhaler Inhale 2 puffs into the lungs every 4 (four) hours as needed for wheezing or shortness of breath.   3   No current facility-administered medications for this visit.    Musculoskeletal: Strength & Muscle Tone: within normal limits Gait & Station: normal Patient leans: N/A  Psychiatric Specialty Exam: Review of Systems  unknown if currently breastfeeding.There is no height or weight on file to calculate BMI.  General Appearance: Well Groomed  Eye Contact:  Good  Speech:  Clear and Coherent and Normal Rate  Volume:  Normal  Mood:  Euthymic  Affect:  Congruent  Thought  Process:  Coherent, Goal Directed and Linear  Orientation:  Full (Time, Place, and Person)  Thought Content:  WDL and Logical  Suicidal Thoughts:  No  Homicidal Thoughts:  No  Memory:  Immediate;   Good Recent;   Good Remote;   Good  Judgement:  Good  Insight:  Good  Psychomotor Activity:  Normal  Concentration:  Concentration: Good and Attention Span: Good  Recall:  Good  Fund of Knowledge:Good  Language: Good  Akathisia:  No  Handed:  Right  AIMS (if indicated): Not done  Assets:  Communication Skills Desire for Improvement Financial Resources/Insurance Housing Intimacy Social Support  ADL's:  Intact  Cognition: WNL  Sleep:  Good   Screenings:   Assessment and Plan: Patient reports that she is doing well on her current medication regimen.  No medication changes.  Patient is agreeable to continue all medications as prescribed.  1. Generalized anxiety disorder  Continue- sertraline (ZOLOFT) 100 MG tablet; Take 1 tablet (100 mg total) by mouth daily.  Dispense: 30 tablet; Refill: 2 Continue- buPROPion (WELLBUTRIN) 75 MG tablet; Take 1 tablet (75 mg total) by mouth 2 (two) times daily.  Dispense: 60 tablet; Refill: 2 - Ambulatory referral to Social Work  2. Mild episode of recurrent major depressive disorder (HCC)  Continue- sertraline (ZOLOFT) 100 MG tablet; Take 1 tablet (100 mg total) by mouth daily.  Dispense: 30 tablet; Refill: 2 Continue- buPROPion (WELLBUTRIN) 75 MG tablet; Take 1 tablet (75 mg total) by mouth 2 (two) times daily.  Dispense: 60 tablet; Refill: 2 - Ambulatory referral to Social Work  Follow-up in 3 months Follow-up with therapy  Salley Slaughter, NP 9/15/20218:45 AM

## 2019-12-07 ENCOUNTER — Other Ambulatory Visit: Payer: Self-pay

## 2019-12-07 ENCOUNTER — Ambulatory Visit (INDEPENDENT_AMBULATORY_CARE_PROVIDER_SITE_OTHER): Payer: Medicaid Other | Admitting: Clinical

## 2019-12-07 DIAGNOSIS — F330 Major depressive disorder, recurrent, mild: Secondary | ICD-10-CM | POA: Diagnosis not present

## 2019-12-09 DIAGNOSIS — F330 Major depressive disorder, recurrent, mild: Secondary | ICD-10-CM | POA: Insufficient documentation

## 2019-12-09 NOTE — Progress Notes (Signed)
Comprehensive Clinical Assessment (CCA) Note  12/07/2019 Andrea Lang [id number]  Chief Complaint:  Chief Complaint  Patient presents with  . Depression  . Anxiety   Visit Diagnosis: major depressive disorder, mild with anxious distress   Client is a 29 year old female presenting to [facility] for outpatient therapy services. Client is referred by [facility] psychiatry for a clinical assessment. Client presents with the chief complaint of depression and anxiety. Client reported one previous attempt in early 2021 for therapy but discontinued because virtual was not idea for her. Client reported she endorses loss of motivation, fatigue, depressed mood, worthlessness, and anxiety. Client reported her symptoms began in November 2020 after giving birth to her daughter and the symptoms have persistent infrequently. Client reported a history of passive SI but no plan and/ or intent. Client reported during her childhood her family found out her father was having children outside of her parents' marriage which was devastating. Client reported she was started on Wellbutrin and Zoloft but stopped taking it because it was giving her headaches. Client reported she would like to continue her treatment with individual therapy. Client denies SI/HI/VH, and paranoia. Client was screened for the following SDOH:  GAD 7 : Generalized Anxiety Score 12/07/2019  Nervous, Anxious, on Edge 1  Control/stop worrying 0  Worry too much - different things 0  Trouble relaxing 1  Restless 0  Easily annoyed or irritable 2  Afraid - awful might happen 0  Total GAD 7 Score 4  Anxiety Difficulty Somewhat difficult     Counselor from 12/07/2019 in [employer]  PHQ-9 Total Score 9     Treatment recommendations: Individual therapy. Client declined to continue with medication management at this time.  Therapist provided information on format of appointment (virtual or face to face).   The client was  advised to call back or seek an in-person evaluation if the symptoms worsen or if the condition fails to improve as anticipated before the next scheduled appointment. Client was in agreement with treatment recommendations.    CCA Biopsychosocial Intake/Chief Complaint:  Client reported problems with depression and anxiety.  Current Symptoms/Problems: Client reported loss of motivation, difficulty focusing, and passive S.I.   Patient Reported Schizophrenia/Schizoaffective Diagnosis in Past: No   Strengths: No data recorded Preferences: Client stated, "Finding different ways to cope with things and showing emotions, when something is bothering me, I keep it in".  Abilities: No data recorded  Type of Services Patient Feels are Needed: Therapy   Initial Clinical Notes/Concerns: No data recorded  Mental Health Symptoms Depression:  Change in energy/activity;Difficulty Concentrating;Fatigue;Hopelessness   Duration of Depressive symptoms: Greater than two weeks   Mania:  None   Anxiety:   Tension   Psychosis:  None   Duration of Psychotic symptoms: No data recorded  Trauma:  None   Obsessions:  None   Compulsions:  None   Inattention:  None   Hyperactivity/Impulsivity:  N/A   Oppositional/Defiant Behaviors:  None   Emotional Irregularity:  None   Other Mood/Personality Symptoms:  No data recorded   Mental Status Exam Appearance and self-care  Stature:  Average   Weight:  Average weight   Clothing:  Casual   Grooming:  Normal   Cosmetic use:  Age appropriate   Posture/gait:  Normal   Motor activity:  Not Remarkable   Sensorium  Attention:  Normal   Concentration:  Normal   Orientation:  X5   Recall/memory:  Normal   Affect and Mood  Affect:  Congruent   Mood:  Depressed   Relating  Eye contact:  Normal   Facial expression:  Responsive   Attitude toward examiner:  Cooperative   Thought and Language  Speech flow: Clear and Coherent    Thought content:  Appropriate to Mood and Circumstances   Preoccupation:  None   Hallucinations:  None   Organization:  No data recorded  [employer] of Knowledge:  Good   Intelligence:  Average   Abstraction:  Normal   Judgement:  Good   Reality Testing:  Adequate   Insight:  Good   Decision Making:  Normal   Social Functioning  Social Maturity:  Responsible   Social Judgement:  Normal   Stress  Stressors:  Transitions   Coping Ability:  Optician, dispensing Deficits:  Communication;Self-care   Supports:  Family     Religion: Religion/Spirituality Are You A Religious Person?: No  Leisure/Recreation: Leisure / Recreation Do You Have Hobbies?: Yes Leisure and Hobbies: crafting and catching up on TV shows  Exercise/Diet: Exercise/Diet Do You Exercise?: No Have You Gained or Lost A Significant Amount of Weight in the Past Six Months?: No Do You Follow a Special Diet?: No Do You Have Any Trouble Sleeping?: No   CCA Employment/Education Employment/Work Situation: Employment / Work Copywriter, advertising Employment situation: Unemployed  Education: Education Did Teacher, adult education From [employer]?: Yes Did Physicist, medical?: Yes What Type of College Degree Do you Have?: [place]   CCA Family/Childhood History Family and Relationship History: Family history Marital status: Married Number of Years Married: 2011 What types of issues is patient dealing with in the relationship?: client reported 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 Description of  patient's relationship with caregiver when they were a child: Client reported during her childhood the family found out her father had children outside of her parents marriage. Patient's description of current relationship with people who raised him/her: Client reported she has a relationship with her parents but does not have a close bond with them. Does patient have siblings?: Yes Number of Siblings: 5 Description of patient's current relationship with siblings: Client reported she has 3 sisters and 2 brothers.  Child/Adolescent Assessment:     CCA Substance Use Alcohol/Drug Use: Alcohol / Drug Use History of alcohol / drug use?: No history of alcohol / drug abuse                         ASAM's:  Six Dimensions of Multidimensional Assessment  Dimension 1:  Acute Intoxication and/or Withdrawal Potential:      Dimension 2:  Biomedical Conditions and Complications:      Dimension 3:  Emotional, Behavioral, or Cognitive Conditions and Complications:     Dimension 4:  Readiness to Change:     Dimension 5:  Relapse, Continued use, or Continued Problem Potential:     Dimension 6:  Recovery/Living Environment:     ASAM Severity Score:    ASAM Recommended Level of Treatment:     Substance use Disorder (SUD)    Recommendations for Services/Supports/Treatments:  DSM5 Diagnoses: Patient Active Problem List   Diagnosis Date Noted  . Major depressive disorder, recurrent episode, mild with anxious distress ([place]) 12/09/2019  . Mild episode of recurrent major depressive disorder ([place]) 10/11/2019  . Generalized anxiety disorder 10/11/2019  . Indication for care in labor or delivery 12/09/2018  . VBAC, delivered, current hospitalization 12/09/2018  . Seasonal and perennial allergic rhinoconjunctivitis 06/06/2018  . Allergic conjunctivitis of both eyes 02/02/2018  . Other allergic rhinitis 12/27/2017  . Adverse food reaction 12/27/2017  . Mild intermittent asthma without  complication 28/36/6294  . 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 History of colonic polyps 04/26/2014    Patient Centered Plan: Patient is on the following Treatment Plan(s):  Anxiety and Depression   Referrals to Alternative Service(s): Referred to Alternative Service(s):   Place:   Date:   Time:    Referred to Alternative Service(s):   Place:   Date:   Time:    Referred to Alternative Service(s):   Place:   Date:   Time:    Referred to Alternative Service(s):   Place:   Date:   Time:     Bernestine Amass, LCSW

## 2020-01-10 ENCOUNTER — Encounter (HOSPITAL_COMMUNITY): Payer: Medicaid Other | Admitting: Psychiatry

## 2020-01-30 ENCOUNTER — Ambulatory Visit (INDEPENDENT_AMBULATORY_CARE_PROVIDER_SITE_OTHER): Payer: Medicaid Other | Admitting: Clinical

## 2020-01-30 ENCOUNTER — Other Ambulatory Visit: Payer: Self-pay

## 2020-01-30 DIAGNOSIS — F330 Major depressive disorder, recurrent, mild: Secondary | ICD-10-CM

## 2020-01-30 NOTE — Progress Notes (Signed)
   THERAPIST PROGRESS NOTE  Session Time: 30 minutes  Participation Level: Active  Behavioral Response: CasualAlertDepressed  Type of Therapy: Individual Therapy  Treatment Goals addressed: Diagnosis: Depression  Interventions: CBT  Summary:  Andrea Lang is a 30 y.o. female who presents for the scheduled session oriented times five, appropriately dressed, and friendly. Client denied hallucinations and delusions. Client reported on today she has been feeling overwhelmed since the holidays with having all the children home for school break possibly. Client became tearful discussing her stressors. Client reported having a difficult time communicating with her husband and children. Client reported feeling tired and having depressed over doing the retentiveness of running behind the children and cleaning the house. Client reported most of her time is spent at home. Client recalled a time prior to her third baby working, going to the gym and caring for the kids but since her latest child things moved towards her becoming a full time mother. Client reported she does spend time going to see family or talk to family as often. Client reported  She has activities that she enjoys but is unable to figure out a time when she can do those things. Client reported feeling easily "annoyed" by everything her husband does, with no particular reason. Client reported "he tries to touch me and be loving but I reject it". Client reported he asks her why she acts that way. Client reported she and her husband some time ago use to plan date nights to look forward to.    Suicidal/Homicidal: Nowithout intent/plan  Therapist Response:  Therapist began the session by checking in and asking the client how she has been since last seen. Therapist actively listened to the clients thoughts and feelings and used empathy in response. Therapist engaged with the client to ask open ended questions about her behaviors and try to gain  insight about the origin of her emotions. Therapist used CBT and provided the client with the psychoeducational worksheet about the "thinkig and feeling connection" to read and discuss at the next session. Therapist assigned the client homework to think about a time during the week she can set aside time to practice her self care and/ or do an activity that she'd enjoy doing. Client was scheduled for next appointment.    Plan: Return again in 7 weeks for individual therapy.  Diagnosis: Major depressive disorder, recurrent episode, mild with anxious distress   Neena Rhymes Britta Louth, LCSW 01/30/2020

## 2020-02-13 ENCOUNTER — Other Ambulatory Visit: Payer: Self-pay

## 2020-02-13 ENCOUNTER — Ambulatory Visit (INDEPENDENT_AMBULATORY_CARE_PROVIDER_SITE_OTHER): Payer: Medicaid Other | Admitting: Clinical

## 2020-02-13 DIAGNOSIS — F330 Major depressive disorder, recurrent, mild: Secondary | ICD-10-CM | POA: Diagnosis not present

## 2020-02-13 NOTE — Progress Notes (Signed)
   THERAPIST PROGRESS NOTE Virtual Visit via Video Note  I connected with Andrea Lang on 02/13/20 at 10:00 AM EST by a video enabled telemedicine application and verified that I am speaking with the correct person using two identifiers.  Location: Patient: home Provider: office   I discussed the limitations of evaluation and management by telemedicine and the availability of in person appointments. The patient expressed understanding and agreed to proceed.  Follow Up Instructions: I discussed the assessment and treatment plan with the patient. The patient was provided an opportunity to ask questions and all were answered. The patient agreed with the plan and demonstrated an understanding of the instructions.   The patient was advised to call back or seek an in-person evaluation if the symptoms worsen or if the condition fails to improve as anticipated.   Session Time: 20 minutes  Participation Level: Active  Behavioral Response: CasualAlertEuthymic  Type of Therapy: Individual Therapy  Treatment Goals addressed: Diagnosis: depression  Interventions: CBT  Summary:  Andrea Lang is a 30 y.o. female who presents for the scheduled session oriented times five, appropriately dressed, and friendly.Client denied hallucinations and delusions.  Client reported on today she is doing good. Client reported since the last session not much has changed. Client reported she read over the information sheets from last session. Client discussed thinking about where her anger comes from. Client reported it may be from doing the same thing everyday but is not sure. Client reported she is distant from her family because she feels that they are toxic towards her. Client reported her sisters tend to make things about them and not show interest in what's going on with her life. Client reported she use to enjoy doing physical exercise but over time lost the motivation to continue with it. Client also reported she  also wants to find better ways to communicate with her children when they don't listen to what she instructs them to do.   Suicidal/Homicidal: Nowithout intent/plan  Therapist Response:  Therapist began the session by asking the client how she has been doing since last seen. Therapist actively listened to the clients thoughts and feelings. Therapist used CBT to normalize the clients feelings and asked open ended questions about supporting reasons that support her beliefs. Therapist discussed using discipline when her motivation runs out to lead into her beginning to use physical exercise in her weekly activity. Therapist assigned the client to scheduled one day a week to workout from home. Client was scheduled for more appointments.    Plan: Return again in 6 weeks for individual therapy.  Diagnosis: Major depressive disorder, recurrent episode mild with anxious distress  Birdena Jubilee Veronique Warga, LCSW 02/13/2020

## 2020-06-03 IMAGING — DX DG CHEST 2V
2 series · 2 of 2 positions shown · non-contrast
Comparison: 12/18/2014

CLINICAL DATA: Shortness of breath, left back pain

EXAM:
CHEST - 2 VIEW

[w chest pa]
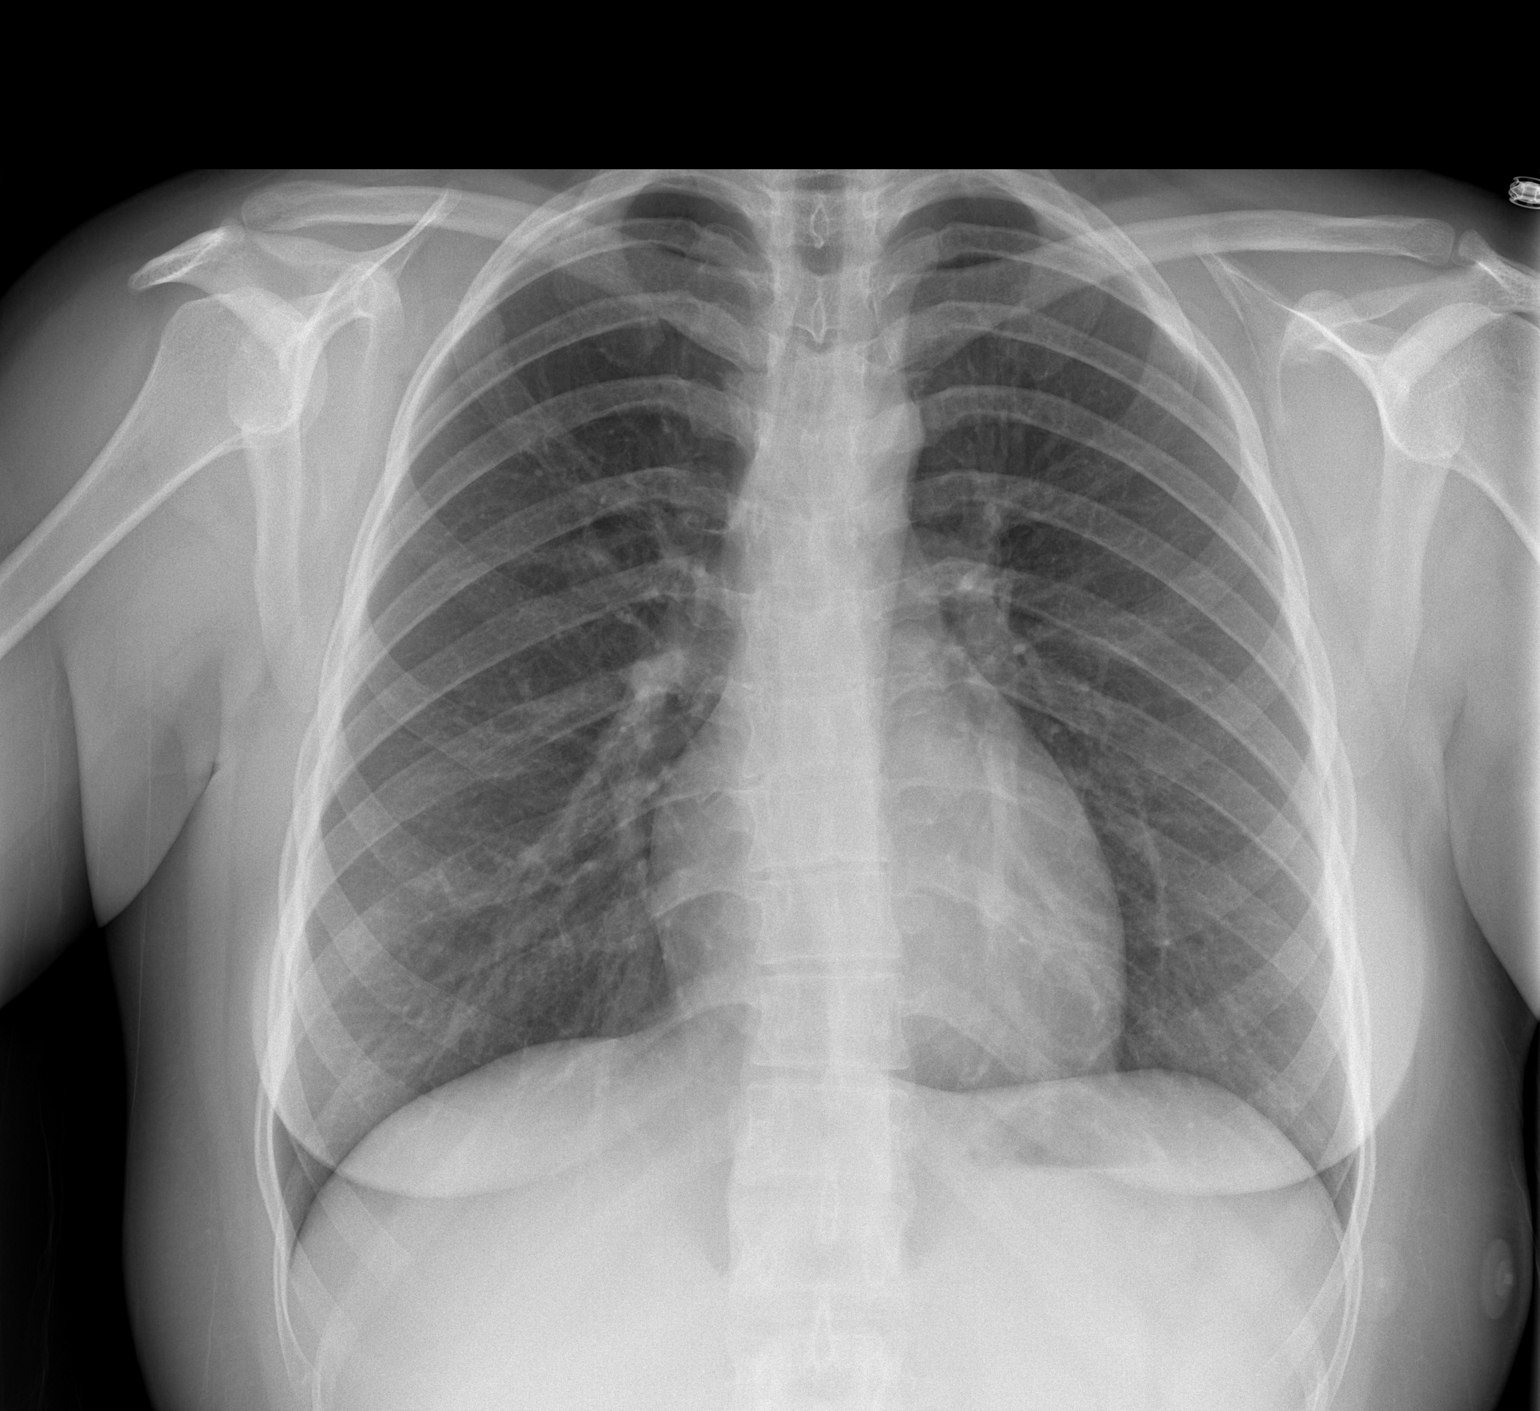

[w chest lat]
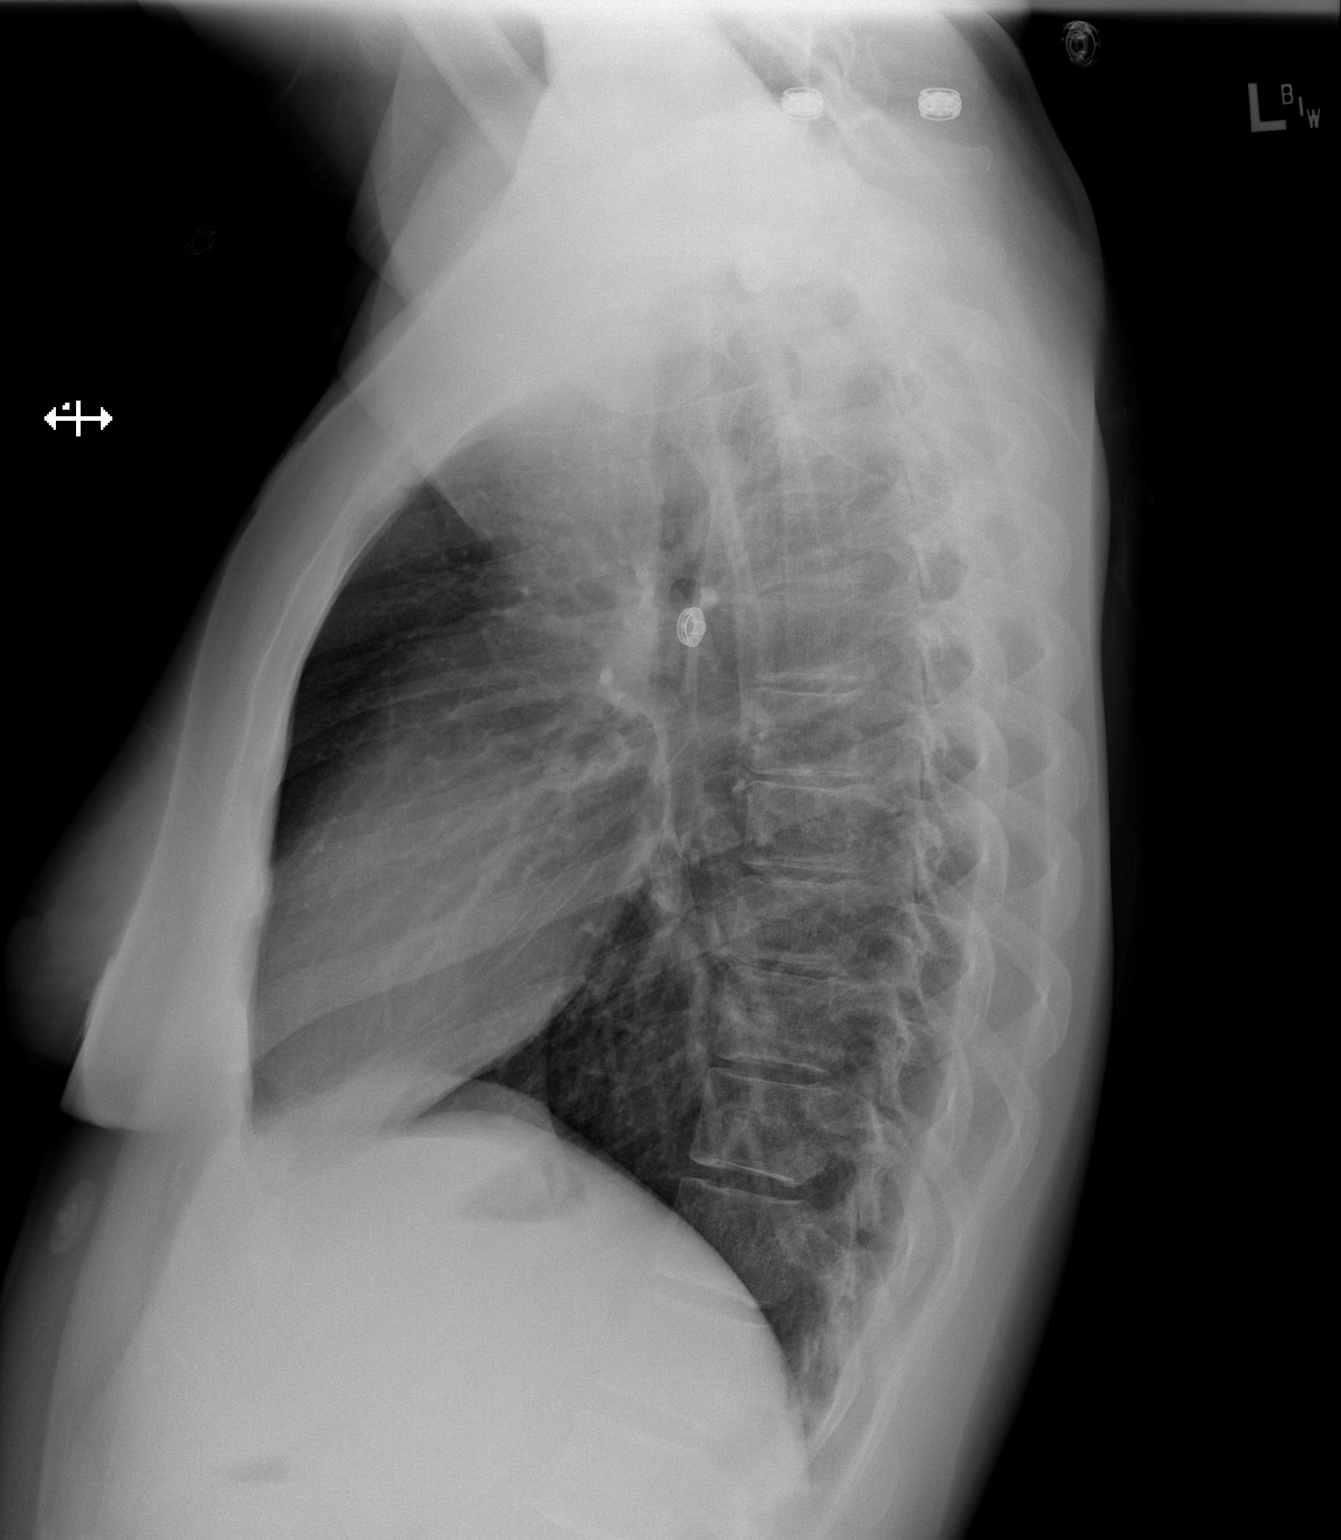

[2 of 2 positions shown; findings below may reference images not displayed]

FINDINGS: Heart and mediastinal contours are within normal limits. No focal
opacities or effusions. No acute bony abnormality.
IMPRESSION: No active cardiopulmonary disease.

## 2020-06-07 ENCOUNTER — Encounter: Payer: Self-pay | Admitting: *Deleted

## 2020-06-10 ENCOUNTER — Encounter: Payer: Self-pay | Admitting: Internal Medicine

## 2020-06-10 ENCOUNTER — Ambulatory Visit (INDEPENDENT_AMBULATORY_CARE_PROVIDER_SITE_OTHER): Payer: Self-pay | Admitting: Internal Medicine

## 2020-06-10 VITALS — BP 110/80 | HR 81 | Ht 66.0 in | Wt 212.0 lb

## 2020-06-10 DIAGNOSIS — D126 Benign neoplasm of colon, unspecified: Secondary | ICD-10-CM

## 2020-06-10 DIAGNOSIS — Z8619 Personal history of other infectious and parasitic diseases: Secondary | ICD-10-CM

## 2020-06-10 DIAGNOSIS — Z1509 Genetic susceptibility to other malignant neoplasm: Secondary | ICD-10-CM

## 2020-06-10 DIAGNOSIS — R150 Incomplete defecation: Secondary | ICD-10-CM

## 2020-06-10 NOTE — Patient Instructions (Signed)
Your provider has requested that you go to the basement level for lab work before leaving today. Press "B" on the elevator. The lab is located at the first door on the left as you exit the elevator.  You have been scheduled for an endoscopy and flexible sigmoidoscopy. Please follow the written instructions given to you at your visit today. If you use inhalers (even only as needed), please bring them with you on the day of your procedure.  Since you have other obligations necessitating your early departure today before we are able to go over instructions, we will get you scheduled for previsit where you can get instructions and ask any questions you may have.  If you are age 52 or younger, your body mass index should be between 19-25. Your Body mass index is 34.22 kg/m. If this is out of the aformentioned range listed, please consider follow up with your Primary Care Provider.   Due to recent changes in healthcare laws, you may see the results of your imaging and laboratory studies on MyChart before your provider has had a chance to review them.  We understand that in some cases there may be results that are confusing or concerning to you. Not all laboratory results come back in the same time frame and the provider may be waiting for multiple results in order to interpret others.  Please give [place] 48 hours in order for your provider to thoroughly review all the results before contacting the office for clarification of your results.

## 2020-06-11 ENCOUNTER — Encounter: Payer: Self-pay | Admitting: Internal Medicine

## 2020-06-11 NOTE — Progress Notes (Signed)
   Subjective:    Patient ID: Andrea Lang, female    DOB: 10/20/1990, 30 y.o.   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 pylori who is here for follow-up.  She is here alone today.  Her last flexible sigmoidoscopy for surveillance was performed on 09/20/2017.  This did not reveal any adenomatous polyps.  She had a upper endoscopy on 10/29/2014 which was normal with the exception of a few small antral erosions and biopsies which showed chronic active gastritis with H. pylori.  She was treated with H. pylori therapy.  She reports she has been doing well.  She has noticed slightly in the last few months some mild bloating symptom.  She also feels at times that despite her multiple bowel movements per day, usually after eating, she still feels some incomplete evacuation symptom.  There is been no blood in her stool or melena.  Her weight has been overall stable.  Good appetite.  No nausea or vomiting.  No dysphagia or odynophagia.  No heartburn symptoms.  Her menstruation is regular.  She had her third child who is now 1, about a year ago.   Review of Systems As per HPI, otherwise negative  Current Medications, Allergies, Past Medical History, Past Surgical History, Family History and Social History were reviewed in [employer] record.     Objective:   Physical Exam BP 110/80   Pulse 81   Ht _0  (1.676 m)   Wt 212 lb (96.2 kg)   LMP 06/03/2020   SpO2 95%   BMI 34.22 kg/m  Gen: awake, alert, NAD HEENT: anicteric Abd: soft, NT/ND, +BS throughout Ext: no c/c/e Neuro: nonfocal     Assessment & Plan:  30 year old female with a past medical history of numerous adenomatous colon polyps status post subtotal colectomy in 2016, 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 pylori who is here for follow-up.    1.  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  Given her residual rectum I recommended repeat flexible sigmoidoscopy at this time.  We reviewed the risk, benefits and alternatives and she is agreeable and wishes to proceed -- CBC, CMP, ferritin, B12 and vitamin D -- Flexible sigmoidoscopy  2.  History of H. pylori gastritis/MSH2 gene variant --I recommend a repeat upper endoscopy to ensure that she has not had recurrent H. pylori but also rule out gastric and duodenal polyps. -- EGD  3.  Incomplete evacuation --symptoms despite having subtotal colectomy.  We are proceeding with endoscopic evaluation as above.  I also recommended she try Align 1 capsule daily as a probiotic for about a month.  20 minutes total spent today including patient facing time, coordination of care, reviewing medical history/procedures/pertinent radiology studies, and documentation of the encounter.

## 2020-06-20 ENCOUNTER — Other Ambulatory Visit (INDEPENDENT_AMBULATORY_CARE_PROVIDER_SITE_OTHER): Payer: Medicaid Other

## 2020-06-20 DIAGNOSIS — Z1509 Genetic susceptibility to other malignant neoplasm: Secondary | ICD-10-CM

## 2020-06-20 DIAGNOSIS — D126 Benign neoplasm of colon, unspecified: Secondary | ICD-10-CM | POA: Diagnosis not present

## 2020-06-20 LAB — CBC WITH DIFFERENTIAL/PLATELET
Basophils Absolute: 0 10*3/uL (ref 0.0–0.1)
Basophils Relative: 0.3 % (ref 0.0–3.0)
Eosinophils Absolute: 0.3 10*3/uL (ref 0.0–0.7)
Eosinophils Relative: 3.4 % (ref 0.0–5.0)
HCT: 39.1 % (ref 36.0–46.0)
Hemoglobin: 13.5 g/dL (ref 12.0–15.0)
Lymphocytes Relative: 28.1 % (ref 12.0–46.0)
Lymphs Abs: 2.3 10*3/uL (ref 0.7–4.0)
MCHC: 34.5 g/dL (ref 30.0–36.0)
MCV: 84.8 fl (ref 78.0–100.0)
Monocytes Absolute: 0.5 10*3/uL (ref 0.1–1.0)
Monocytes Relative: 5.7 % (ref 3.0–12.0)
Neutro Abs: 5.2 10*3/uL (ref 1.4–7.7)
Neutrophils Relative %: 62.5 % (ref 43.0–77.0)
Platelets: 228 10*3/uL (ref 150.0–400.0)
RBC: 4.61 Mil/uL (ref 3.87–5.11)
RDW: 12.5 % (ref 11.5–15.5)
WBC: 8.4 10*3/uL (ref 4.0–10.5)

## 2020-06-20 LAB — COMPREHENSIVE METABOLIC PANEL
ALT: 23 U/L (ref 0–35)
AST: 15 U/L (ref 0–37)
Albumin: 4.2 g/dL (ref 3.5–5.2)
Alkaline Phosphatase: 48 U/L (ref 39–117)
BUN: 13 mg/dL (ref 6–23)
CO2: 26 mEq/L (ref 19–32)
Calcium: 9.2 mg/dL (ref 8.4–10.5)
Chloride: 102 mEq/L (ref 96–112)
Creatinine, Ser: 0.58 mg/dL (ref 0.40–1.20)
GFR: 122.72 mL/min (ref >60.00)
Glucose, Bld: 106 mg/dL — ABNORMAL HIGH (ref 70–99)
Potassium: 3.6 mEq/L (ref 3.5–5.1)
Sodium: 137 mEq/L (ref 135–145)
Total Bilirubin: 0.6 mg/dL (ref 0.2–1.2)
Total Protein: 7.1 g/dL (ref 6.0–8.3)

## 2020-06-20 LAB — FERRITIN: Ferritin: 43.3 ng/mL (ref 10.0–291.0)

## 2020-06-20 LAB — VITAMIN B12: Vitamin B-12: 1156 pg/mL — ABNORMAL HIGH (ref 211–911)

## 2020-06-25 LAB — VITAMIN D 1,25 DIHYDROXY
Vitamin D 1, 25 (OH)2 Total: 59 pg/mL (ref 18–72)
Vitamin D2 1, 25 (OH)2: <8 pg/mL
Vitamin D3 1, 25 (OH)2: 59 pg/mL

## 2020-08-14 ENCOUNTER — Other Ambulatory Visit: Payer: Self-pay

## 2020-08-14 ENCOUNTER — Ambulatory Visit (AMBULATORY_SURGERY_CENTER): Payer: Self-pay

## 2020-08-14 VITALS — Ht 66.0 in | Wt 202.0 lb

## 2020-08-14 DIAGNOSIS — Z86010 Personal history of colonic polyps: Secondary | ICD-10-CM

## 2020-08-14 DIAGNOSIS — Z1509 Genetic susceptibility to other malignant neoplasm: Secondary | ICD-10-CM

## 2020-08-14 NOTE — Progress Notes (Signed)
Pre visit completed via phone call; Patient verified name, DOB, and address;  No egg or soy allergy known to patient  No issues with past sedation with any surgeries or procedures Patient denies ever being told they had issues or difficulty with intubation  No FH of Malignant Hyperthermia No diet pills per patient No home 02 use per patient  No blood thinners per patient   Pt denies issues with constipation but does report she feels "like I am not emptying all the way when I have a bowel movement";  No A fib or A flutter  EMMI video via [place] 19 guidelines implemented in PV today with Pt and RN   NO PA's for preps discussed with pt in PV today  Discussed with pt there will be an out-of-pocket cost for prep and that varies from $0 to 70 dollars   Due to the COVID-19 pandemic we are asking patients to follow certain guidelines.  Pt aware of COVID protocols and [facility] guidelines

## 2020-09-03 ENCOUNTER — Telehealth: Payer: Self-pay | Admitting: Internal Medicine

## 2020-09-03 NOTE — Telephone Encounter (Signed)
Returned patient call.  Mag Citrate is on national recall currently.  After speaking to MD Carlean Purl, RN relayed to the patient to purchase 4-'5mg'$  dulcolax laxative tablets to take in place of the mag citrate.  Patient verbalized understanding.

## 2020-09-03 NOTE — Telephone Encounter (Signed)
Patient called states she is having issues with finding the Magnesium Citrate for her prep tomorrow.

## 2020-09-04 ENCOUNTER — Ambulatory Visit (AMBULATORY_SURGERY_CENTER): Payer: Medicaid Other | Admitting: Internal Medicine

## 2020-09-04 ENCOUNTER — Encounter: Payer: Self-pay | Admitting: Internal Medicine

## 2020-09-04 ENCOUNTER — Other Ambulatory Visit: Payer: Self-pay

## 2020-09-04 VITALS — BP 118/67 | HR 58 | Temp 98.6°F | Resp 9 | Ht 66.0 in | Wt 202.0 lb

## 2020-09-04 DIAGNOSIS — Z860100 Personal history of colon polyps, unspecified: Secondary | ICD-10-CM

## 2020-09-04 DIAGNOSIS — K9189 Other postprocedural complications and disorders of digestive system: Secondary | ICD-10-CM

## 2020-09-04 DIAGNOSIS — Z1509 Genetic susceptibility to other malignant neoplasm: Secondary | ICD-10-CM

## 2020-09-04 DIAGNOSIS — K319 Disease of stomach and duodenum, unspecified: Secondary | ICD-10-CM

## 2020-09-04 DIAGNOSIS — K209 Esophagitis, unspecified without bleeding: Secondary | ICD-10-CM

## 2020-09-04 DIAGNOSIS — Z8619 Personal history of other infectious and parasitic diseases: Secondary | ICD-10-CM

## 2020-09-04 HISTORY — PX: SIGMOIDOSCOPY: SUR1295

## 2020-09-04 MED ORDER — SODIUM CHLORIDE 0.9 % IV SOLN: 500.0000 mL | INTRAVENOUS | Status: DC

## 2020-09-04 NOTE — Op Note (Signed)
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 The patient's tolerance of                            previous anesthesia was also reviewed. The risks                            and benefits of the procedure and the sedation                            options and risks were discussed with the patient.                            All questions were answered, and informed consent                            was obtained. Prior Anticoagulants: The patient has                            taken no previous anticoagulant or antiplatelet                            agents. ASA Grade Assessment: II - A patient with                            mild systemic disease. After reviewing the risks                            and benefits, the patient was deemed in  satisfactory condition to undergo the procedure.                           After obtaining informed consent, the scope was                            passed under direct vision. The Endoscope was                            introduced through the anus and advanced to the neo                            terminal ileum. The flexible sigmoidoscopy was                            accomplished  without difficulty. The patient                            tolerated the procedure well. The quality of the                            bowel preparation was good. Scope In: Scope Out: Findings:                 The perianal and digital rectal examinations were                            normal.                           Patchy mild inflammation characterized by erythema                            and nodularity was found in the neo-terminal ileum                            and at the anastomosis ileocolonic anastomosis.                            Biopsies were taken with a cold forceps for                            histology.                           The rectum and recto-sigmoid colon appeared normal.                            No colonic/rectal polyps.                           The retroflexed view of the distal rectum and anal                            verge was normal and showed no anal or rectal  abnormalities. Complications:            No immediate complications. Estimated Blood Loss:     Estimated blood loss was minimal. Impression:               - Patchy, scattered mild inflammation was found in                            the neo-terminal ileum and at the                            ileocolonic/rectal anastomosis. Biopsied.                           - The rectum and recto-sigmoid colon are normal.                           - No colon polyps. Recommendation:           - Patient has a contact number available for                            emergencies. The signs and symptoms of potential                            delayed complications were discussed with the                            patient. Return to normal activities tomorrow.                            Written discharge instructions were provided to the                            patient.                           - Resume previous diet.                           - Await pathology results.                            - Repeat flexible sigmoidoscopy in 1 year for                            surveillance. Jerene Bears, MD 09/04/2020 12:03:42 PM This report has been signed electronically.

## 2020-09-04 NOTE — Progress Notes (Signed)
Called to room to assist during endoscopic procedure.  Patient ID and intended procedure confirmed with present staff. Received instructions for my participation in the procedure from the performing physician.  

## 2020-09-04 NOTE — Op Note (Signed)
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 Pylori associated gastritis, last EGD                            2016 Medicines:                Monitored Anesthesia Care Procedure:                Pre-Anesthesia Assessment:                           - Prior to the procedure, a History and Physical                            was performed, and patient medications and                            allergies were reviewed. The patient's tolerance of                            previous anesthesia was also reviewed. The risks                            and benefits of the procedure and the sedation                            options and risks were discussed with the patient.                            All questions were answered, and informed consent                            was obtained. Prior Anticoagulants: The patient has                            taken no previous anticoagulant or antiplatelet                            agents. ASA Grade Assessment: II - A patient with                            mild systemic disease. After reviewing the risks                            and benefits, the patient was deemed in  satisfactory condition to undergo the procedure.                           - Prior to the procedure, a History and Physical                            was performed, and patient medications and                            allergies were reviewed. The patient's tolerance of                            previous anesthesia was also reviewed. The risks                            and benefits of the procedure and the sedation                             options and risks were discussed with the patient.                            All questions were answered, and informed consent                            was obtained. Prior Anticoagulants: The patient has                            taken no previous anticoagulant or antiplatelet                            agents. ASA Grade Assessment: II - A patient with                            mild systemic disease. After reviewing the risks                            and benefits, the patient was deemed in                            satisfactory condition to undergo the procedure.                           After obtaining informed consent, the endoscope was                            passed under direct vision. Throughout the                            procedure, the patient's blood pressure, pulse, and                            oxygen saturations were monitored continuously. The  Endoscope was introduced through the mouth, and                            advanced to the second part of duodenum. The upper                            GI endoscopy was accomplished without difficulty.                            The patient tolerated the procedure well. Scope In: Scope Out: Findings:                 LA Grade A (one or more mucosal breaks less than 5                            mm, not extending between tops of 2 mucosal folds)                            esophagitis was found at the gastroesophageal                            junction.                           A 1 cm hiatal hernia was present.                           The entire examined stomach was normal. Biopsies                            were taken with a cold forceps for histology and                            Helicobacter pylori testing.                           The examined duodenum was normal. Complications:            No immediate complications. Estimated Blood Loss:     Estimated blood loss was  minimal. Impression:               - Mild acid reflux esophagitis.                           - 1 cm hiatal hernia.                           - Normal stomach. Biopsied.                           - Normal examined duodenum. Recommendation:           - Patient has a contact number available for                            emergencies. The signs and symptoms of potential  delayed complications were discussed with the                            patient. Return to normal activities tomorrow.                            Written discharge instructions were provided to the                            patient.                           - Resume previous diet.                           - Continue present medications.                           - Await pathology results.                           - Repeat EGD interval to be determined by biopsy                            results given MSH2 gene mutation/Lynch syndrome. Jerene Bears, MD 09/04/2020 12:06:49 PM This report has been signed electronically.

## 2020-09-04 NOTE — Progress Notes (Signed)
Report to PACU, RN, vss, BBS= Clear.  

## 2020-09-04 NOTE — Progress Notes (Signed)
Vs by cw.  Previsit over the phone.  No changes to health hx

## 2020-09-04 NOTE — Progress Notes (Signed)
[facility] History and Physical   Primary Care Physician:  Berkley Harvey, NP   Reason for Procedure:  [facility] 2 gene mutation, history of multiple colon polyps  Plan:    Upper endoscopy and flexible sigmoidoscopy.  Patient is appropriate for procedures in the setting today.     HPI: Andrea Lang is a 30 y.o. female with a history as documented in my note on 06/10/2020 and below.  She is here for upper endoscopy and flexible sigmoidoscopy.  No change in medical history or complaint.   Past Medical History:  Diagnosis Date   Allergy    Asthma    no real dx; but requires use of an inhaler for occassional SOB, more with pregnancy   GERD (gastroesophageal reflux disease)    OCCASIONAL    Helicobacter pylori gastritis    Seasonal allergies    Sinusitis    Tubular adenoma of colon    with high grade dysplasia; greater than 50 tubular adenomas in 2016   VBAC, delivered, current hospitalization 12/09/2018    Past Surgical History:  Procedure Laterality Date   CESAREAN SECTION  2009   COLONOSCOPY  2016   JMP-MAC-polyposis syndrome/multiple polyps (TA x 5)   FLEXIBLE SIGMOIDOSCOPY  2019   JMP-MAC-good prep-patent end side to side ileocolonic anastomosis with inflammation   LAPAROSCOPIC PARTIAL COLECTOMY N/A 06/20/2014   Procedure: LAPAROSCOPIC TOTAL ABDOMINAL  COLECTOMY;  Surgeon: [name], MD;  Location: [facility];  Service: General;  Laterality: N/A;   POLYPECTOMY  2016   poypoid polyps/TA x 5   TONSILLECTOMY      Prior to Admission medications   Medication Sig Start Date End Date Taking? Authorizing Provider  cetirizine (ZYRTEC) 10 MG tablet Take 10 mg by mouth daily.   Yes [provider]  EPINEPHrine (EPIPEN 2-PAK) 0.3 mg/0.3 mL IJ SOAJ injection Inject 0.3 mLs (0.3 mg total) into the muscle once as needed for up to 1 dose. Patient not taking: No sig reported 12/27/17   Garnet Sierras, DO  fluticasone [facility]) 50 MCG/ACT nasal spray Place 2 sprays  into both nostrils daily. 08/01/20   [provider]  PROAIR HFA 108 (90 BASE) MCG/ACT inhaler Inhale 2 puffs into the lungs every 4 (four) hours as needed for wheezing or shortness of breath.  05/01/14   [provider]    Current Outpatient Medications  Medication Sig Dispense Refill   cetirizine (ZYRTEC) 10 MG tablet Take 10 mg by mouth daily.     EPINEPHrine (EPIPEN 2-PAK) 0.3 mg/0.3 mL IJ SOAJ injection Inject 0.3 mLs (0.3 mg total) into the muscle once as needed for up to 1 dose. (Patient not taking: No sig reported) 2 Device 2   fluticasone (FLONASE) 50 MCG/ACT nasal spray Place 2 sprays into both nostrils daily.     PROAIR HFA 108 (90 BASE) MCG/ACT inhaler Inhale 2 puffs into the lungs every 4 (four) hours as needed for wheezing or shortness of breath.   3   Current Facility-Administered Medications  Medication Dose Route Frequency Provider Last Rate Last Admin   0.9 %  sodium chloride infusion  500 mL Intravenous Continuous Mardene Lessig, Lajuan Lines, MD        Allergies as of 09/04/2020 - Review Complete 09/04/2020  Allergen Reaction Noted   Other Swelling 09/08/2017   Aspirin  05/22/2014   Avocado Swelling 01/18/2014   Banana Swelling 01/18/2014   Food Swelling 03/28/2011   Kiwi extract Swelling 01/18/2014   Mangifera indica Swelling 01/18/2014  Nsaids Other (See Comments) 05/22/2014   Latex Rash 01/27/2011    Family History  Problem Relation Age of Onset   Colon cancer Neg Hx    Esophageal cancer Neg Hx    Rectal cancer Neg Hx    Stomach cancer Neg Hx    Colon polyps Neg Hx     Social History   Socioeconomic History   Marital status: Married    Spouse name: Not on file   Number of children: 2   Years of education: Not on file   Highest education level: Not on file  Occupational History   Occupation: student  Tobacco Use   Smoking status: Never   Smokeless tobacco: Never  Vaping Use   Vaping Use: Never used  Substance and Sexual Activity   Alcohol  use: No    Alcohol/week: 0.0 standard drinks   Drug use: No   Sexual activity: Yes    Birth control/protection: Condom, I.U.D.  Other Topics Concern   Not on file  Social History Narrative   Not on file   Social Determinants of Health   Financial Resource Strain: Not on file  Food Insecurity: Not on file  Transportation Needs: Not on file  Physical Activity: Not on file  Stress: Not on file  Social Connections: Not on file  Intimate Partner Violence: Not on file    Review of Systems: Positive for none All other review of systems negative except as mentioned in the HPI.  Physical Exam: Vital signs BP 119/74   Pulse (!) 59   Temp 98.6 F (37 C)   Resp 16   Ht _0  (1.676 m)   Wt 202 lb (91.6 kg)   LMP 08/27/2020 (Approximate)   SpO2 100%   Breastfeeding Unknown   BMI 32.60 kg/m   General:   Alert,  Well-developed, well-nourished, pleasant and cooperative in NAD Lungs:  Clear throughout to auscultation.   Heart:  Regular rate and rhythm; no murmurs, clicks, rubs,  or gallops. Abdomen:  Soft, nontender and nondistended. Normal bowel sounds.   Neuro/Psych:  Alert and cooperative. Normal mood and affect. A and O x 3

## 2020-09-04 NOTE — Patient Instructions (Signed)
YOU HAD AN ENDOSCOPIC PROCEDURE TODAY AT [facility]:   Refer to the procedure report that was given to you for any specific questions about what was found during the examination.  If the procedure report does not answer your questions, please call your gastroenterologist to clarify.  If you requested that your care partner not be given the details of your procedure findings, then the procedure report has been included in a sealed envelope for you to review at your convenience later.  YOU SHOULD EXPECT: Some feelings of bloating in the abdomen. Passage of more gas than usual.  Walking can help get rid of the air that was put into your GI tract during the procedure and reduce the bloating. If you had a lower endoscopy (such as a colonoscopy or flexible sigmoidoscopy) you may notice spotting of blood in your stool or on the toilet paper. If you underwent a bowel prep for your procedure, you may not have a normal bowel movement for a few days.  Please Note:  You might notice some irritation and congestion in your nose or some drainage.  This is from the oxygen used during your procedure.  There is no need for concern and it should clear up in a day or so.  SYMPTOMS TO REPORT IMMEDIATELY:  Following lower endoscopy (colonoscopy or flexible sigmoidoscopy):  Excessive amounts of blood in the stool  Significant tenderness or worsening of abdominal pains  Swelling of the abdomen that is new, acute  Fever of 100F or higher  Following upper endoscopy (EGD)  Vomiting of blood or coffee ground material  New chest pain or pain under the shoulder blades  Painful or persistently difficult swallowing  New shortness of breath  Fever of 100F or higher  Black, tarry-looking stools  For urgent or emergent issues, a gastroenterologist can be reached at any hour by calling [phone]. Do not use MyChart messaging for urgent concerns.    DIET:  We do recommend a small meal at first, but  then you may proceed to your regular diet.  Drink plenty of fluids but you should avoid alcoholic beverages for 24 hours.  ACTIVITY:  You should plan to take it easy for the rest of today and you should NOT DRIVE or use heavy machinery until tomorrow (because of the sedation medicines used during the test).    FOLLOW UP: Our staff will call the number listed on your records 48-72 hours following your procedure to check on you and address any questions or concerns that you may have regarding the information given to you following your procedure. If we do not reach you, we will leave a message.  We will attempt to reach you two times.  During this call, we will ask if you have developed any symptoms of COVID 19. If you develop any symptoms (ie: fever, flu-like symptoms, shortness of breath, cough etc.) before then, please call [phone].  If you test positive for Covid 19 in the 2 weeks post procedure, please call and report this information to [place].    If any biopsies were taken you will be contacted by phone or by letter within the next 1-3 weeks.  Please call us at [phone] if you have not heard about the biopsies in 3 weeks.   Read all of the handouts given to you by your recovery room nurse.  You will need a repeat flex sigmoid in 1 year.     SIGNATURES/CONFIDENTIALITY: You and/or your care partner  have signed paperwork which will be entered into your electronic medical record.  These signatures attest to the fact that that the information above on your After Visit Summary has been reviewed and is understood.  Full responsibility of the confidentiality of this discharge information lies with you and/or your care-partner. YOU HAD AN ENDOSCOPIC PROCEDURE TODAY AT [facility]:   Refer to the procedure report that was given to you for any specific questions about what was found during the examination.  If the procedure report does not answer your questions, please call your  gastroenterologist to clarify.  If you requested that your care partner not be given the details of your procedure findings, then the procedure report has been included in a sealed envelope for you to review at your convenience later.  YOU SHOULD EXPECT: Some feelings of bloating in the abdomen. Passage of more gas than usual.  Walking can help get rid of the air that was put into your GI tract during the procedure and reduce the bloating. If you had a lower endoscopy (such as a colonoscopy or flexible sigmoidoscopy) you may notice spotting of blood in your stool or on the toilet paper. If you underwent a bowel prep for your procedure, you may not have a normal bowel movement for a few days.  Please Note:  You might notice some irritation and congestion in your nose or some drainage.  This is from the oxygen used during your procedure.  There is no need for concern and it should clear up in a day or so.  SYMPTOMS TO REPORT IMMEDIATELY:  Following lower endoscopy (colonoscopy or flexible sigmoidoscopy):  Excessive amounts of blood in the stool  Significant tenderness or worsening of abdominal pains  Swelling of the abdomen that is new, acute  Fever of 100F or higher  Following upper endoscopy (EGD)  Vomiting of blood or coffee ground material  New chest pain or pain under the shoulder blades  Painful or persistently difficult swallowing  New shortness of breath  Fever of 100F or higher  Black, tarry-looking stools  For urgent or emergent issues, a gastroenterologist can be reached at any hour by calling [phone]. Do not use MyChart messaging for urgent concerns.    DIET:  We do recommend a small meal at first, but then you may proceed to your regular diet.  Drink plenty of fluids but you should avoid alcoholic beverages for 24 hours.  ACTIVITY:  You should plan to take it easy for the rest of today and you should NOT DRIVE or use heavy machinery until tomorrow (because of the  sedation medicines used during the test).    FOLLOW UP: Our staff will call the number listed on your records 48-72 hours following your procedure to check on you and address any questions or concerns that you may have regarding the information given to you following your procedure. If we do not reach you, we will leave a message.  We will attempt to reach you two times.  During this call, we will ask if you have developed any symptoms of COVID 19. If you develop any symptoms (ie: fever, flu-like symptoms, shortness of breath, cough etc.) before then, please call [phone].  If you test positive for Covid 19 in the 2 weeks post procedure, please call and report this information to [place].    If any biopsies were taken you will be contacted by phone or by letter within the next 1-3 weeks.  Please  call us at [phone] if you have not heard about the biopsies in 3 weeks.    SIGNATURES/CONFIDENTIALITY: You and/or your care partner have signed paperwork which will be entered into your electronic medical record.  These signatures attest to the fact that that the information above on your After Visit Summary has been reviewed and is understood.  Full responsibility of the confidentiality of this discharge information lies with you and/or your care-partner.

## 2020-09-06 ENCOUNTER — Telehealth: Payer: Self-pay

## 2020-09-06 ENCOUNTER — Telehealth: Payer: Self-pay | Admitting: *Deleted

## 2020-09-06 NOTE — Telephone Encounter (Signed)
  Follow up Call-  Call back number 09/04/2020  Post procedure Call Back phone  # [phone]  Permission to leave phone message Yes  Some recent data might be hidden     Patient questions:  Do you have a fever, pain , or abdominal swelling? No. Pain Score  0 *  Have you tolerated food without any problems? Yes.    Have you been able to return to your normal activities? Yes.    Do you have any questions about your discharge instructions: Diet   No. Medications  No. Follow up visit  No.  Do you have questions or concerns about your Care? No.  Actions: * If pain score is 4 or above: No action needed, pain <4.

## 2020-09-06 NOTE — Telephone Encounter (Signed)
  Follow up Call-  Call back number 09/04/2020  Post procedure Call Back phone  # [phone]  Permission to leave phone message Yes  Some recent data might be hidden     Patient questions:   Message left to call us if necessary.

## 2020-09-09 ENCOUNTER — Other Ambulatory Visit: Payer: Self-pay

## 2020-09-09 MED ORDER — CIPROFLOXACIN HCL 500 MG PO TABS: 500.0000 mg | ORAL_TABLET | Freq: Two times a day (BID) | ORAL | 0 refills | Status: AC

## 2020-12-16 ENCOUNTER — Ambulatory Visit (HOSPITAL_COMMUNITY): Payer: Medicaid Other | Admitting: Clinical

## 2020-12-16 ENCOUNTER — Telehealth (HOSPITAL_COMMUNITY): Payer: Self-pay | Admitting: Clinical

## 2020-12-16 NOTE — Telephone Encounter (Signed)
Therapist sent the client a link for the mychart video virtual therapy visit which was scheduled. Client did not respond to the link. Therapist attempted to call the client via tele-phone. Client did answer the phone and did not have voice mail available.

## 2021-02-10 ENCOUNTER — Encounter (HOSPITAL_COMMUNITY): Payer: Self-pay

## 2021-02-10 ENCOUNTER — Ambulatory Visit (HOSPITAL_COMMUNITY): Payer: Medicaid Other | Admitting: Clinical

## 2021-02-10 ENCOUNTER — Telehealth (HOSPITAL_COMMUNITY): Payer: Self-pay | Admitting: Clinical

## 2021-02-10 NOTE — Telephone Encounter (Signed)
Therapist sent the client in week for individual therapy session via MyChart via text message.  Client did not respond to the link to check-in.  Therapist followed up with a attempted phone call to the clients telephone number on file.  Client did not answer the phone.  Therapist left a voicemail indicating reason for phone call and provided office phone number parent to call back and reschedule the appointment.

## 2021-07-31 ENCOUNTER — Other Ambulatory Visit: Payer: Self-pay | Admitting: Nurse Practitioner

## 2021-07-31 DIAGNOSIS — R102 Pelvic and perineal pain: Secondary | ICD-10-CM

## 2021-07-31 DIAGNOSIS — N926 Irregular menstruation, unspecified: Secondary | ICD-10-CM

## 2021-08-04 ENCOUNTER — Ambulatory Visit
Admission: RE | Admit: 2021-08-04 | Discharge: 2021-08-04 | Disposition: A | Discharge status: Home / Self Care | Payer: Medicaid Other | Source: Ambulatory Visit | Attending: Nurse Practitioner | Admitting: Nurse Practitioner

## 2021-08-04 DIAGNOSIS — R102 Pelvic and perineal pain: Secondary | ICD-10-CM

## 2021-08-04 DIAGNOSIS — N926 Irregular menstruation, unspecified: Secondary | ICD-10-CM

## 2021-09-01 ENCOUNTER — Ambulatory Visit (AMBULATORY_SURGERY_CENTER): Payer: Self-pay | Admitting: *Deleted

## 2021-09-01 VITALS — Ht 66.0 in | Wt 199.0 lb

## 2021-09-01 DIAGNOSIS — Z1509 Genetic susceptibility to other malignant neoplasm: Secondary | ICD-10-CM

## 2021-09-01 DIAGNOSIS — Z86010 Personal history of colonic polyps: Secondary | ICD-10-CM

## 2021-09-01 NOTE — Progress Notes (Signed)
Patient is here in-person for PV. Patient denies any allergies to eggs or soy. Patient denies any problems with anesthesia/sedation. Patient is not on any oxygen at home. Patient is not taking any diet/weight loss medications or blood thinners. Went over procedure prep instructions with the patient. Patient is aware of our care-partner policy.

## 2021-09-18 ENCOUNTER — Other Ambulatory Visit: Payer: Medicaid Other | Admitting: Internal Medicine

## 2021-10-28 ENCOUNTER — Encounter: Payer: Self-pay | Admitting: Internal Medicine

## 2021-10-28 ENCOUNTER — Ambulatory Visit (AMBULATORY_SURGERY_CENTER): Payer: Medicaid Other | Admitting: Internal Medicine

## 2021-10-28 VITALS — BP 110/70 | HR 67 | Temp 98.4°F | Resp 13 | Ht 66.0 in | Wt 199.0 lb

## 2021-10-28 DIAGNOSIS — Z09 Encounter for follow-up examination after completed treatment for conditions other than malignant neoplasm: Secondary | ICD-10-CM | POA: Diagnosis not present

## 2021-10-28 DIAGNOSIS — Z86010 Personal history of colonic polyps: Secondary | ICD-10-CM

## 2021-10-28 DIAGNOSIS — Z1509 Genetic susceptibility to other malignant neoplasm: Secondary | ICD-10-CM | POA: Diagnosis not present

## 2021-10-28 MED ORDER — SODIUM CHLORIDE 0.9 % IV SOLN: 500.0000 mL | Freq: Once | INTRAVENOUS | Status: DC

## 2021-10-28 NOTE — Progress Notes (Signed)
Pt resting comfortably. VSS. Airway intact. SBAR complete to RN. All questions answered.   

## 2021-10-28 NOTE — Patient Instructions (Addendum)
RECOMMENDATIONS: - Patient has a contact number available for emergencies. The signs and symptoms of potential delayed complications were discussed with the patient. Return to normal activities tomorrow. Written discharge instructions were provided to the patient. - Resume previous diet. - Continue present medications. - Repeat flexible sigmoidoscopy in 2 years for surveillance.  YOU HAD AN ENDOSCOPIC PROCEDURE TODAY AT [facility]:   Refer to the procedure report that was given to you for any specific questions about what was found during the examination.  If the procedure report does not answer your questions, please call your gastroenterologist to clarify.  If you requested that your care partner not be given the details of your procedure findings, then the procedure report has been included in a sealed envelope for you to review at your convenience later.  YOU SHOULD EXPECT: Some feelings of bloating in the abdomen. Passage of more gas than usual.  Walking can help get rid of the air that was put into your GI tract during the procedure and reduce the bloating. If you had a lower endoscopy (such as a colonoscopy or flexible sigmoidoscopy) you may notice spotting of blood in your stool or on the toilet paper. If you underwent a bowel prep for your procedure, you may not have a normal bowel movement for a few days.  Please Note:  You might notice some irritation and congestion in your nose or some drainage.  This is from the oxygen used during your procedure.  There is no need for concern and it should clear up in a day or so.  SYMPTOMS TO REPORT IMMEDIATELY:  Following lower endoscopy (colonoscopy or flexible sigmoidoscopy):  Excessive amounts of blood in the stool  Significant tenderness or worsening of abdominal pains  Swelling of the abdomen that is new, acute  Fever of 100F or higher   For urgent or emergent issues, a gastroenterologist can be reached at any hour by  calling [phone]. Do not use MyChart messaging for urgent concerns.    DIET:  We do recommend a small meal at first, but then you may proceed to your regular diet.  Drink plenty of fluids but you should avoid alcoholic beverages for 24 hours.  ACTIVITY:  You should plan to take it easy for the rest of today and you should NOT DRIVE or use heavy machinery until tomorrow (because of the sedation medicines used during the test).    FOLLOW UP: Our staff will call the number listed on your records the next business day following your procedure.  We will call around 7:15- 8:00 am to check on you and address any questions or concerns that you may have regarding the information given to you following your procedure. If we do not reach you, we will leave a message.     If any biopsies were taken you will be contacted by phone or by letter within the next 1-3 weeks.  Please call us at [phone] if you have not heard about the biopsies in 3 weeks.    SIGNATURES/CONFIDENTIALITY: You and/or your care partner have signed paperwork which will be entered into your electronic medical record.  These signatures attest to the fact that that the information above on your After Visit Summary has been reviewed and is understood.  Full responsibility of the confidentiality of this discharge information lies with you and/or your care-partner.

## 2021-10-28 NOTE — Op Note (Signed)
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 The patient's tolerance of                            previous anesthesia was also reviewed. The risks                            and benefits of the procedure and the sedation                            options and risks were discussed with the patient.                            All questions were answered, and informed consent                            was obtained. Prior Anticoagulants: The patient has                            taken no previous anticoagulant or antiplatelet                            agents. ASA Grade Assessment: II - A patient with                            mild systemic disease. After reviewing the risks  and benefits, the patient was deemed in                            satisfactory condition to undergo the procedure.                           After obtaining informed consent, the scope was                            passed under direct vision. The Olympus PCF-H190DL                            ([id number]) Colonoscope was introduced through the                            anus  and advanced to the ileo-rectal anastomosis.                            The flexible sigmoidoscopy was accomplished without                            difficulty. The patient tolerated the procedure                            well. The quality of the bowel preparation was                            excellent. Scope In: 10:50:54 AM Scope Out: 10:56:30 AM Scope Withdrawal Time: 0 hours 4 minutes 18 seconds  Total Procedure Duration: 0 hours 5 minutes 36 seconds  Findings:                 The digital rectal exam was normal.                           The neo-ileum appeared normal.                           There was evidence of a prior end-to-side                            ileo-colonic anastomosis in the recto-sigmoid                            colon. This was patent and was characterized by                            mild inflammation (as seen and biopsied previously                            without worsening). The anastomosis was traversed.                           The rectum appeared normal including on retroflexed  views. No polyps. Complications:            No immediate complications. Estimated Blood Loss:     Estimated blood loss: none. Impression:               - The terminal ileum is normal.                           - Patent end-to-side ileo-colonic anastomosis,                            characterized by mild inflammation.                           - The rectum is normal.                           - No specimens collected. Recommendation:           - Patient has a contact number available for                            emergencies. The signs and symptoms of potential                            delayed complications were discussed with the                            patient. Return to normal activities tomorrow.                            Written discharge instructions were provided to the                            patient.                            - Resume previous diet.                           - Continue present medications.                           - Repeat flexible sigmoidoscopy in 2 years for                            surveillance. Jerene Bears, MD 10/28/2021 11:02:18 AM This report has been signed electronically.

## 2021-10-28 NOTE — Progress Notes (Signed)
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  The nature of the procedure, as well as the risks, benefits, and alternatives were carefully and thoroughly reviewed with the patient. Ample time for discussion and questions allowed. The patient understood, was satisfied, and agreed to proceed.     HPI: Andrea Lang is a 31 y.o. female who presents for flexible sigmoidoscopy.  Medical history as below.  Tolerated the prep.  No recent chest pain or shortness of breath.  No abdominal pain today.  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; greater than 50 tubular adenomas in 2016   VBAC, delivered, current hospitalization 12/09/2018    Past Surgical History:  Procedure Laterality Date   CESAREAN SECTION  2009   COLONOSCOPY  2016   JMP-MAC-polyposis syndrome/multiple polyps (TA x 5)   FLEXIBLE SIGMOIDOSCOPY  2019   JMP-MAC-good prep-patent end side to side ileocolonic anastomosis with inflammation   LAPAROSCOPIC PARTIAL COLECTOMY N/A 06/20/2014   Procedure: LAPAROSCOPIC TOTAL ABDOMINAL  COLECTOMY;  Surgeon: [name], MD;  Location: [facility];  Service: General;  Laterality: N/A;   POLYPECTOMY  2016   poypoid polyps/TA x 5   SIGMOIDOSCOPY  09/04/2020   Dr.Karrington Mccravy   TONSILLECTOMY      Prior to Admission medications   Medication Sig Start Date End Date Taking? Authorizing Provider  albuterol (VENTOLIN HFA) 108 (90 Base) MCG/ACT  inhaler 90 puffs. 10/09/21  Yes [provider]  cetirizine (ZYRTEC) 10 MG tablet Take 10 mg by mouth daily.    [provider]  EPINEPHrine (EPIPEN 2-PAK) 0.3 mg/0.3 mL IJ SOAJ injection Inject 0.3 mLs (0.3 mg total) into the muscle once as needed for up to 1 dose. Patient not taking: Reported on 08/14/2020 12/27/17   Garnet Sierras, DO  Multiple Vitamin (MULTIVITAMIN) tablet Take 1 tablet by mouth daily.    [provider]    Current Outpatient Medications  Medication Sig Dispense Refill   albuterol (VENTOLIN HFA) 108 (90 Base) MCG/ACT inhaler 90 puffs.     cetirizine (ZYRTEC) 10 MG tablet Take 10 mg by mouth daily.     EPINEPHrine (EPIPEN 2-PAK) 0.3 mg/0.3 mL IJ SOAJ injection Inject 0.3 mLs (0.3 mg total) into the muscle once as needed for up to 1 dose. (Patient not taking: Reported on 08/14/2020) 2 Device 2   Multiple Vitamin (MULTIVITAMIN) tablet Take 1 tablet by mouth daily.     Current Facility-Administered Medications  Medication Dose Route Frequency Provider Last Rate Last Admin   0.9 %  sodium chloride infusion  500 mL Intravenous Once Earvin Blazier, Lajuan Lines, MD        Allergies as of 10/28/2021 - Review Complete 09/01/2021  Allergen Reaction Noted   Other Swelling 09/08/2017   Aspirin  05/22/2014   Avocado Swelling 01/18/2014   Banana Swelling 01/18/2014   Food Swelling 03/28/2011   Kiwi  extract Swelling 01/18/2014   Mangifera indica Swelling 01/18/2014   Nsaids Other (See Comments) 05/22/2014   Latex Rash 01/27/2011    Family History  Problem Relation Age of Onset   Colon cancer Neg Hx    Esophageal cancer Neg Hx    Rectal cancer Neg Hx    Stomach cancer Neg Hx    Colon polyps Neg Hx     Social History   Socioeconomic History   Marital status: Married    Spouse name: Not on file   Number of children: 2   Years of education: Not on file   Highest education level: Not on file  Occupational History   Occupation: student  Tobacco Use   Smoking  status: Never   Smokeless tobacco: Never  Vaping Use   Vaping Use: Never used  Substance and Sexual Activity   Alcohol use: No    Alcohol/week: 0.0 standard drinks of alcohol   Drug use: No   Sexual activity: Yes    Birth control/protection: Condom  Other Topics Concern   Not on file  Social History Narrative   Not on file   Social Determinants of Health   Financial Resource Strain: Not on file  Food Insecurity: Not on file  Transportation Needs: Not on file  Physical Activity: Not on file  Stress: Not on file  Social Connections: Not on file  Intimate Partner Violence: Not on file    Physical Exam: Vital signs in last 24 hours: _0  (!) 94/59   Pulse 74   Temp 98.4 F (36.9 C) (Temporal)   Ht _1  (1.676 m)   Wt 199 lb (90.3 kg)   SpO2 100%   BMI 32.12 kg/m  GEN: NAD EYE: Sclerae anicteric ENT: MMM CV: Non-tachycardic Pulm: CTA b/l GI: Soft, NT/ND NEURO:  Alert & Oriented x 3   Zenovia Jarred, MD [facility]  10/28/2021 10:33 AM

## 2021-10-28 NOTE — Progress Notes (Signed)
Pt's states no medical or surgical changes since previsit or office visit. 

## 2021-10-29 ENCOUNTER — Telehealth: Payer: Self-pay

## 2021-10-29 NOTE — Telephone Encounter (Signed)
  Follow up Call-     10/28/2021    9:49 AM 09/04/2020   10:55 AM  Call back number  Post procedure Call Back phone  # [phone]  Permission to leave phone message Yes Yes     Patient questions:  Do you have a fever, pain , or abdominal swelling? No. Pain Score  0 *  Have you tolerated food without any problems? Yes.    Have you been able to return to your normal activities? Yes.    Do you have any questions about your discharge instructions: Diet   No. Medications  No. Follow up visit  No.  Do you have questions or concerns about your Care? No.  Actions: * If pain score is 4 or above: No action needed, pain <4.

## 2022-02-05 ENCOUNTER — Other Ambulatory Visit: Payer: Self-pay

## 2022-02-05 ENCOUNTER — Encounter (HOSPITAL_COMMUNITY): Payer: Self-pay | Admitting: Emergency Medicine

## 2022-02-05 ENCOUNTER — Emergency Department (HOSPITAL_COMMUNITY): Payer: Medicaid Other

## 2022-02-05 ENCOUNTER — Emergency Department (HOSPITAL_COMMUNITY)
Admission: EM | Admit: 2022-02-05 | Discharge: 2022-02-06 | Disposition: A | Discharge status: Home / Self Care | Payer: Medicaid Other | Attending: Emergency Medicine | Admitting: Emergency Medicine

## 2022-02-05 DIAGNOSIS — Z91040 Latex allergy status: Secondary | ICD-10-CM | POA: Insufficient documentation

## 2022-02-05 DIAGNOSIS — R0602 Shortness of breath: Secondary | ICD-10-CM | POA: Insufficient documentation

## 2022-02-05 DIAGNOSIS — R079 Chest pain, unspecified: Secondary | ICD-10-CM | POA: Insufficient documentation

## 2022-02-05 LAB — BASIC METABOLIC PANEL
Anion gap: 8 (ref 5–15)
BUN: 13 mg/dL (ref 6–20)
CO2: 25 mmol/L (ref 22–32)
Calcium: 9 mg/dL (ref 8.9–10.3)
Chloride: 105 mmol/L (ref 98–111)
Creatinine, Ser: 0.72 mg/dL (ref 0.44–1.00)
GFR, Estimated: >60 mL/min (ref >60)
Glucose, Bld: 126 mg/dL — ABNORMAL HIGH (ref 70–99)
Potassium: 3.4 mmol/L — ABNORMAL LOW (ref 3.5–5.1)
Sodium: 138 mmol/L (ref 135–145)

## 2022-02-05 LAB — I-STAT BETA HCG BLOOD, ED (MC, WL, AP ONLY): I-stat hCG, quantitative: <5 m[IU]/mL (ref <5)

## 2022-02-05 LAB — CBC
HCT: 39.2 % (ref 36.0–46.0)
Hemoglobin: 13.1 g/dL (ref 12.0–15.0)
MCH: 29.1 pg (ref 26.0–34.0)
MCHC: 33.4 g/dL (ref 30.0–36.0)
MCV: 87.1 fL (ref 80.0–100.0)
Platelets: 255 10*3/uL (ref 150–400)
RBC: 4.5 MIL/uL (ref 3.87–5.11)
RDW: 11.9 % (ref 11.5–15.5)
WBC: 8.4 10*3/uL (ref 4.0–10.5)
nRBC: 0 % (ref 0.0–0.2)

## 2022-02-05 LAB — TROPONIN I (HIGH SENSITIVITY): Troponin I (High Sensitivity): <2 ng/L (ref <18)

## 2022-02-05 NOTE — ED Triage Notes (Addendum)
Per EMS, left sided sharp/dull non radiating chest pain starting at 3pm.    1nitro (no effect on BP, but relieved pain.) 20G L AC  150/90 P 98 RR 20 98% RA

## 2022-02-06 LAB — TROPONIN I (HIGH SENSITIVITY): Troponin I (High Sensitivity): <2 ng/L (ref <18)

## 2022-02-06 NOTE — ED Provider Notes (Signed)
[facility] EMERGENCY DEPARTMENT Provider Note   CSN: [id number] Arrival date & time: 02/05/22  2227     History  Chief Complaint  Patient presents with   Chest Pain    Andrea Lang is a 32 y.o. female.  The history is provided by the patient and medical records.  Chest Pain  32 year old female with no significant past medical history presenting to the ED with chest pain.  This began around 3 PM today while she was watching television.  States pain midsternal, described as dull without radiation.  She briefly had some shortness of breath but that quickly resolved.  She was given 1 nitroglycerin by EMS.  States pain has greatly improved since then.  She denies any prior cardiac history.  Denies any smoking history or history of drug use.  No cough, fever, or other upper respiratory symptoms.  Home Medications Prior to Admission medications   Medication Sig Start Date End Date Taking? Authorizing Provider  albuterol (VENTOLIN HFA) 108 (90 Base) MCG/ACT inhaler 90 puffs. 10/09/21   [provider]  cetirizine (ZYRTEC) 10 MG tablet Take 10 mg by mouth daily.    [provider]  EPINEPHrine (EPIPEN 2-PAK) 0.3 mg/0.3 mL IJ SOAJ injection Inject 0.3 mLs (0.3 mg total) into the muscle once as needed for up to 1 dose. Patient not taking: Reported on 08/14/2020 12/27/17   Garnet Sierras, DO  Multiple Vitamin (MULTIVITAMIN) tablet Take 1 tablet by mouth daily.    [provider]      Allergies    Other, Aspirin, Avocado, Banana, Food, Kiwi extract, Mangifera indica, Nsaids, and Latex    Review of Systems   Review of Systems  Cardiovascular:  Positive for chest pain.  All other systems reviewed and are negative.   Physical Exam Updated Vital Signs BP 100/65 (BP Location: Left Arm)   Pulse 72   Temp 98 F (36.7 C) (Oral)   Resp 18   Ht '5\' 6"'$  (1.676 m)   Wt 83.5 kg   SpO2 100%   BMI 29.70 kg/m   Physical Exam Vitals and nursing  note reviewed.  Constitutional:      Appearance: She is well-developed.  HENT:     Head: Normocephalic and atraumatic.  Eyes:     Conjunctiva/sclera: Conjunctivae normal.     Pupils: Pupils are equal, round, and reactive to light.  Cardiovascular:     Rate and Rhythm: Normal rate and regular rhythm.     Heart sounds: Normal heart sounds.  Pulmonary:     Effort: Pulmonary effort is normal.     Breath sounds: Normal breath sounds.  Abdominal:     General: Bowel sounds are normal.     Palpations: Abdomen is soft.  Musculoskeletal:        General: Normal range of motion.     Cervical back: Normal range of motion.  Skin:    General: Skin is warm and dry.  Neurological:     Mental Status: She is alert and oriented to person, place, and time.     ED Results / Procedures / Treatments   Labs (all labs ordered are listed, but only abnormal results are displayed) Labs Reviewed  BASIC METABOLIC PANEL - Abnormal; Notable for the following components:      Result Value   Potassium 3.4 (*)    Glucose, Bld 126 (*)    All other components within normal limits  CBC  I-STAT BETA HCG BLOOD, ED ([facility],  [facility], [facility] ONLY)  TROPONIN I (HIGH SENSITIVITY)  TROPONIN I (HIGH SENSITIVITY)    EKG EKG Interpretation  Date/Time:  Thursday February 05 2022 23:05:54 EST Ventricular Rate:  94 PR Interval:  140 QRS Duration: 84 QT Interval:  376 QTC Calculation: 470 R Axis:   91 Text Interpretation: Normal sinus rhythm Rightward axis Borderline ECG No previous ECGs available Confirmed by Addison Lank [phone]) on 02/06/2022 2:48:08 AM  Radiology DG Chest 2 View  Result Date: 02/05/2022 CLINICAL DATA:  Chest pain EXAM: CHEST - 2 VIEW COMPARISON:  Chest radiograph dated December 12, 2017 FINDINGS: The heart size and mediastinal contours are within normal limits. Both lungs are clear. The visualized skeletal structures are unremarkable. IMPRESSION: No active cardiopulmonary disease. Electronically Signed    By: Keane Police D.O.   On: 02/05/2022 23:30    Procedures Procedures    Medications Ordered in ED Medications - No data to display  ED Course/ Medical Decision Making/ A&P                           Medical Decision Making Amount and/or Complexity of Data Reviewed Labs: ordered. Radiology: ordered.   32 year old female here with chest pain.  Otherwise healthy, no prior cardiac history and no significant risk factors.  EKG is nonischemic.  Labs are reassuring including troponin x 2.  Chest x-ray is clear.  She is PERC negative.  Given reassuring evaluation, low suspicion for ACS, PE, dissection, other acute cardiac event.  She will follow-up closely with her primary care doctor, given copies of labs and imaging studies for physician review.  She may return here for any new or acute changes.  Final Clinical Impression(s) / ED Diagnoses Final diagnoses:  Chest pain in adult    Rx / DC Orders ED Discharge Orders     None         Larene Pickett, PA-C 02/06/22 [place], MD 02/06/22 [phone]

## 2022-02-06 NOTE — Discharge Instructions (Signed)
Cardiac work-up today was normal. We do recommend that you follow-up with your primary care doctor-- work-up from today on back for their review. Can return here for new concerns.

## 2022-02-06 NOTE — ED Notes (Signed)
AVS reviewed with pt prior to discharge. Pt verbalizes understanding of teaching. Belongings with pt upon depart. Ambulatory to POV by self.

## 2022-04-07 ENCOUNTER — Encounter (HOSPITAL_BASED_OUTPATIENT_CLINIC_OR_DEPARTMENT_OTHER): Payer: Self-pay | Admitting: Obstetrics and Gynecology

## 2022-04-07 NOTE — Progress Notes (Signed)
Spoke w/ via phone for pre-op interview---Andrea Lang needs dos----  UPT per anesthesia. Surgeon orders pending             Lang results------EKG in Epic dated 01/2022 COVID test -----patient states asymptomatic no test needed Arrive at -------0845 NPO after MN NO Solid Food.  Clear liquids from MN until---0745 Med rec completed Medications to take morning of surgery -----Bring Albuterol inhaler Diabetic medication ----- Patient instructed no nail polish to be worn day of surgery Patient instructed to bring photo id and insurance card day of surgery Patient aware to have Driver (ride ) / caregiver Husband Andrea Lang   for 24 hours after surgery  Patient Special Instructions ----- Pre-Op special Istructions ----- Patient verbalized understanding of instructions that were given at this phone interview. Patient denies shortness of breath, chest pain, fever, cough at this phone interview.

## 2022-04-08 NOTE — H&P (Signed)
Andrea Lang is an 32 y.o. female with cervical mass on [place].  Has grown - will attempt to biopsy and/or remove.  Have d/w pt if not able to get tissue diagnosis with study w MRI.  Pt has normal pap with HR HPV neg.  D/W pt r/b/a of hysteroscopy/D&C, also process and expectations.  Pertinent Gynecological History: [id number] LTCS, then VBAC x 2 Pap 02/2022 WNL, HR HPV  No abn pap No STD Menstrual History:  Patient's last menstrual period was 03/24/2022 (exact date).    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; greater than 50 tubular adenomas in 2016   VBAC, delivered, current hospitalization 12/09/2018    Past Surgical History:  Procedure Laterality Date   CESAREAN SECTION  2009   COLONOSCOPY  2016   JMP-MAC-polyposis syndrome/multiple polyps (TA x 5)   FLEXIBLE SIGMOIDOSCOPY  2019   JMP-MAC-good prep-patent end side to side ileocolonic anastomosis with inflammation   LAPAROSCOPIC PARTIAL COLECTOMY N/A 06/20/2014   Procedure: LAPAROSCOPIC TOTAL ABDOMINAL  COLECTOMY;  Surgeon: [name], MD;  Location: [facility];  Service: General;  Laterality: N/A;   POLYPECTOMY  2016   poypoid polyps/TA x 5   SIGMOIDOSCOPY  09/04/2020   Dr.Pyrtle   TONSILLECTOMY      Family History  Problem Relation Age of Onset   Colon cancer Neg Hx    Esophageal cancer Neg Hx    Rectal cancer Neg Hx    Stomach cancer Neg Hx    Colon polyps Neg Hx   HTN, DM  Social History:  reports that she has never smoked. She has never used smokeless tobacco. She reports that she does not drink alcohol and does not use drugs.marrried, works at [employer]:  Allergies  Allergen Reactions   Other Swelling     Melons   Aspirin     Bleeding colon polyps   Avocado Swelling   Banana Swelling   Food Swelling    Mango,banana, & avacodo   Swelling of the lips   Kiwi Extract Swelling   Mangifera Indica Swelling   Nsaids Other (See Comments)    Polyposis : colon, surgery sch   Latex Rash    Meds: fluticasone, Zyrtec, Ventolin  Review of Systems  Constitutional: Negative.   Respiratory: Negative.    Cardiovascular: Negative.   Gastrointestinal: Negative.   Genitourinary: Negative.   Musculoskeletal: Negative.   Skin: Negative.   Neurological: Negative.   Psychiatric/Behavioral: Negative.      Last menstrual period 03/24/2022, unknown if currently breastfeeding. Physical Exam Constitutional:      Appearance: Normal appearance.  Cardiovascular:     Rate and Rhythm: Normal rate and regular rhythm.  Pulmonary:     Breath sounds: Normal breath sounds.  Abdominal:     General: Bowel sounds are normal.     Palpations: Abdomen is soft.  Musculoskeletal:        General: Normal range of motion.     Cervical back: Normal range of motion and neck supple.  Skin:    General: Skin is warm and dry.  Neurological:     General:  No focal deficit present.     Mental Status: She is alert and oriented to person, place, and time.  Psychiatric:        Mood and Affect: Mood normal.        Behavior: Behavior normal.   US reveals cervical mass - has grown in last year.  Hypervascular, heterogenous (anterior L lateral cervix) - nl anteverted uterus, nl ovaries  Assessment/Plan: 32yo [id number] with cervical mass - for hysteroscopic resection vs biopsy D/w pt r/b/a, process and expectations Will proceed  Andrea Lang 04/08/2022, 9:43 PM

## 2022-04-13 ENCOUNTER — Encounter (HOSPITAL_BASED_OUTPATIENT_CLINIC_OR_DEPARTMENT_OTHER): Payer: Self-pay | Admitting: Obstetrics and Gynecology

## 2022-04-13 ENCOUNTER — Ambulatory Visit (HOSPITAL_BASED_OUTPATIENT_CLINIC_OR_DEPARTMENT_OTHER)
Admission: RE | Admit: 2022-04-13 | Discharge: 2022-04-13 | Disposition: A | Discharge status: Home / Self Care | Payer: Medicaid Other | Source: Ambulatory Visit | Attending: Obstetrics and Gynecology | Admitting: Obstetrics and Gynecology

## 2022-04-13 ENCOUNTER — Ambulatory Visit (HOSPITAL_BASED_OUTPATIENT_CLINIC_OR_DEPARTMENT_OTHER): Payer: Medicaid Other | Admitting: Anesthesiology

## 2022-04-13 ENCOUNTER — Other Ambulatory Visit: Payer: Self-pay

## 2022-04-13 ENCOUNTER — Encounter (HOSPITAL_BASED_OUTPATIENT_CLINIC_OR_DEPARTMENT_OTHER)
Admission: RE | Disposition: A | Discharge status: Home / Self Care | Payer: Self-pay | Source: Ambulatory Visit | Attending: Obstetrics and Gynecology

## 2022-04-13 DIAGNOSIS — N841 Polyp of cervix uteri: Secondary | ICD-10-CM | POA: Diagnosis not present

## 2022-04-13 DIAGNOSIS — N888 Other specified noninflammatory disorders of cervix uteri: Secondary | ICD-10-CM

## 2022-04-13 DIAGNOSIS — Z01818 Encounter for other preprocedural examination: Secondary | ICD-10-CM

## 2022-04-13 HISTORY — PX: DILATATION & CURETTAGE/HYSTEROSCOPY WITH MYOSURE: SHX6511

## 2022-04-13 LAB — COMPREHENSIVE METABOLIC PANEL
ALT: 19 U/L (ref 0–44)
AST: 15 U/L (ref 15–41)
Albumin: 4.6 g/dL (ref 3.5–5.0)
Alkaline Phosphatase: 42 U/L (ref 38–126)
Anion gap: 5 (ref 5–15)
BUN: 12 mg/dL (ref 6–20)
CO2: 23 mmol/L (ref 22–32)
Calcium: 8.8 mg/dL — ABNORMAL LOW (ref 8.9–10.3)
Chloride: 108 mmol/L (ref 98–111)
Creatinine, Ser: 0.58 mg/dL (ref 0.44–1.00)
GFR, Estimated: >60 mL/min (ref >60)
Glucose, Bld: 97 mg/dL (ref 70–99)
Potassium: 3.5 mmol/L (ref 3.5–5.1)
Sodium: 136 mmol/L (ref 135–145)
Total Bilirubin: 0.7 mg/dL (ref 0.3–1.2)
Total Protein: 7.9 g/dL (ref 6.5–8.1)

## 2022-04-13 LAB — CBC
HCT: 41.1 % (ref 36.0–46.0)
Hemoglobin: 13.6 g/dL (ref 12.0–15.0)
MCH: 28.6 pg (ref 26.0–34.0)
MCHC: 33.1 g/dL (ref 30.0–36.0)
MCV: 86.5 fL (ref 80.0–100.0)
Platelets: 255 10*3/uL (ref 150–400)
RBC: 4.75 MIL/uL (ref 3.87–5.11)
RDW: 12 % (ref 11.5–15.5)
WBC: 7 10*3/uL (ref 4.0–10.5)
nRBC: 0 % (ref 0.0–0.2)

## 2022-04-13 LAB — POCT PREGNANCY, URINE: Preg Test, Ur: NEGATIVE

## 2022-04-13 SURGERY — DILATATION & CURETTAGE/HYSTEROSCOPY WITH MYOSURE
Anesthesia: General

## 2022-04-13 MED ORDER — OXYCODONE HCL 5 MG PO TABS: 5.0000 mg | ORAL_TABLET | Freq: Four times a day (QID) | ORAL | 0 refills | Status: DC | PRN

## 2022-04-13 MED ORDER — PROPOFOL 10 MG/ML IV BOLUS
INTRAVENOUS | Status: AC
  Filled 2022-04-13: qty 20

## 2022-04-13 MED ORDER — MIDAZOLAM HCL 2 MG/2ML IJ SOLN
INTRAMUSCULAR | Status: AC
  Filled 2022-04-13: qty 2

## 2022-04-13 MED ORDER — ONDANSETRON HCL 4 MG/2ML IJ SOLN
INTRAMUSCULAR | Status: AC
  Filled 2022-04-13: qty 2

## 2022-04-13 MED ORDER — FENTANYL CITRATE (PF) 100 MCG/2ML IJ SOLN
INTRAMUSCULAR | Status: DC | PRN
  Administered 2022-04-13: 25 ug via INTRAVENOUS
  Administered 2022-04-13: 50 ug via INTRAVENOUS
  Administered 2022-04-13: 25 ug via INTRAVENOUS

## 2022-04-13 MED ORDER — LIDOCAINE HCL (PF) 2 % IJ SOLN
INTRAMUSCULAR | Status: AC
  Filled 2022-04-13: qty 5

## 2022-04-13 MED ORDER — DEXAMETHASONE SODIUM PHOSPHATE 10 MG/ML IJ SOLN
INTRAMUSCULAR | Status: AC
  Filled 2022-04-13: qty 1

## 2022-04-13 MED ORDER — FENTANYL CITRATE (PF) 100 MCG/2ML IJ SOLN
INTRAMUSCULAR | Status: AC
  Filled 2022-04-13: qty 2

## 2022-04-13 MED ORDER — ACETAMINOPHEN 500 MG PO TABS
1000.0000 mg | ORAL_TABLET | ORAL | Status: AC
  Administered 2022-04-13: 1000 mg via ORAL

## 2022-04-13 MED ORDER — ONDANSETRON HCL 4 MG/2ML IJ SOLN
INTRAMUSCULAR | Status: DC | PRN
  Administered 2022-04-13: 4 mg via INTRAVENOUS

## 2022-04-13 MED ORDER — DEXAMETHASONE SODIUM PHOSPHATE 10 MG/ML IJ SOLN
INTRAMUSCULAR | Status: DC | PRN
  Administered 2022-04-13: 10 mg via INTRAVENOUS

## 2022-04-13 MED ORDER — ACETAMINOPHEN 500 MG PO TABS
ORAL_TABLET | ORAL | Status: AC
  Filled 2022-04-13: qty 2

## 2022-04-13 MED ORDER — LIDOCAINE HCL 1 % IJ SOLN
INTRAMUSCULAR | Status: DC | PRN
  Administered 2022-04-13: 10 mL

## 2022-04-13 MED ORDER — POVIDONE-IODINE 10 % EX SWAB: 2.0000 | Freq: Once | CUTANEOUS | Status: DC

## 2022-04-13 MED ORDER — FENTANYL CITRATE (PF) 100 MCG/2ML IJ SOLN
25.0000 ug | INTRAMUSCULAR | Status: DC | PRN
  Administered 2022-04-13 (×2): 25 ug via INTRAVENOUS

## 2022-04-13 MED ORDER — MIDAZOLAM HCL 5 MG/5ML IJ SOLN
INTRAMUSCULAR | Status: DC | PRN
  Administered 2022-04-13: 2 mg via INTRAVENOUS

## 2022-04-13 MED ORDER — AMISULPRIDE (ANTIEMETIC) 5 MG/2ML IV SOLN
INTRAVENOUS | Status: AC
  Filled 2022-04-13: qty 2

## 2022-04-13 MED ORDER — PHENYLEPHRINE 80 MCG/ML (10ML) SYRINGE FOR IV PUSH (FOR BLOOD PRESSURE SUPPORT)
PREFILLED_SYRINGE | INTRAVENOUS | Status: AC
  Filled 2022-04-13: qty 10

## 2022-04-13 MED ORDER — PHENYLEPHRINE 80 MCG/ML (10ML) SYRINGE FOR IV PUSH (FOR BLOOD PRESSURE SUPPORT)
PREFILLED_SYRINGE | INTRAVENOUS | Status: DC | PRN
  Administered 2022-04-13: 160 ug via INTRAVENOUS
  Administered 2022-04-13: 80 ug via INTRAVENOUS
  Administered 2022-04-13: 160 ug via INTRAVENOUS

## 2022-04-13 MED ORDER — AMISULPRIDE (ANTIEMETIC) 5 MG/2ML IV SOLN
10.0000 mg | Freq: Once | INTRAVENOUS | Status: AC | PRN
  Administered 2022-04-13: 10 mg via INTRAVENOUS

## 2022-04-13 MED ORDER — PROPOFOL 10 MG/ML IV BOLUS
INTRAVENOUS | Status: DC | PRN
  Administered 2022-04-13: 20 mg via INTRAVENOUS
  Administered 2022-04-13: 200 mg via INTRAVENOUS

## 2022-04-13 MED ORDER — LACTATED RINGERS IV SOLN: INTRAVENOUS | Status: DC

## 2022-04-13 MED ORDER — LIDOCAINE 2% (20 MG/ML) 5 ML SYRINGE
INTRAMUSCULAR | Status: DC | PRN
  Administered 2022-04-13: 60 mg via INTRAVENOUS

## 2022-04-13 SURGICAL SUPPLY — 22 items
BLADE SURG 15 STRL LF DISP TIS (BLADE) IMPLANT
BLADE SURG 15 STRL SS (BLADE) ×1
CATH ROBINSON RED A/P 16FR (CATHETERS) ×1 IMPLANT
DEVICE MYOSURE LITE (MISCELLANEOUS) IMPLANT
DEVICE MYOSURE REACH (MISCELLANEOUS) IMPLANT
DILATOR CANAL MILEX (MISCELLANEOUS) IMPLANT
DRSG TELFA 3X8 NADH STRL (GAUZE/BANDAGES/DRESSINGS) ×1 IMPLANT
GAUZE 4X4 16PLY ~~LOC~~+RFID DBL (SPONGE) ×2 IMPLANT
GLOVE BIO SURGEON STRL SZ 6.5 (GLOVE) ×1 IMPLANT
GOWN STRL REUS W/TWL LRG LVL3 (GOWN DISPOSABLE) ×1 IMPLANT
IV NS IRRIG 3000ML ARTHROMATIC (IV SOLUTION) ×1 IMPLANT
KIT PROCEDURE FLUENT (KITS) ×1 IMPLANT
KIT TURNOVER CYSTO (KITS) ×1 IMPLANT
PACK VAGINAL MINOR WOMEN LF (CUSTOM PROCEDURE TRAY) ×1 IMPLANT
PAD OB MATERNITY 4.3X12.25 (PERSONAL CARE ITEMS) ×1 IMPLANT
SEAL CERVICAL OMNI LOK (ABLATOR) IMPLANT
SEAL ROD LENS SCOPE MYOSURE (ABLATOR) ×1 IMPLANT
SLEEVE SCD COMPRESS KNEE MED (STOCKING) ×1 IMPLANT
SUT VIC AB 2-0 SH 27 (SUTURE) ×1
SUT VIC AB 2-0 SH 27XBRD (SUTURE) IMPLANT
SUT VIC AB 3-0 SH 27 (SUTURE) ×1
SUT VIC AB 3-0 SH 27X BRD (SUTURE) IMPLANT

## 2022-04-13 NOTE — Anesthesia Preprocedure Evaluation (Addendum)
Anesthesia Evaluation  Patient identified by MRN, date of birth, ID band Patient awake    Reviewed: Allergy & Precautions, NPO status , Patient's Chart, lab work & pertinent test results  Airway Mallampati: II  TM Distance: >3 FB Neck ROM: Full    Dental  (+) Dental Advisory Given   Pulmonary asthma    breath sounds clear to auscultation       Cardiovascular negative cardio ROS  Rhythm:Regular Rate:Normal     Neuro/Psych negative neurological ROS     GI/Hepatic Neg liver ROS,GERD  ,,  Endo/Other  negative endocrine ROS    Renal/GU negative Renal ROS     Musculoskeletal   Abdominal   Peds  Hematology negative hematology ROS (+)   Anesthesia Other Findings   Reproductive/Obstetrics                             Anesthesia Physical Anesthesia Plan  ASA: 2  Anesthesia Plan: General   Post-op Pain Management: Tylenol PO (pre-op)*   Induction: Intravenous  PONV Risk Score and Plan: 3 and Dexamethasone, Ondansetron, Midazolam and Treatment may vary due to age or medical condition  Airway Management Planned: LMA  Additional Equipment:   Intra-op Plan:   Post-operative Plan: Extubation in OR  Informed Consent: I have reviewed the patients History and Physical, chart, labs and discussed the procedure including the risks, benefits and alternatives for the proposed anesthesia with the patient or authorized representative who has indicated his/her understanding and acceptance.     Dental advisory given  Plan Discussed with: CRNA  Anesthesia Plan Comments:        Anesthesia Quick Evaluation

## 2022-04-13 NOTE — Transfer of Care (Signed)
Immediate Anesthesia Transfer of Care Note  Patient: Vickki Hearing  Procedure(s) Performed: DILATATION & CURETTAGE/HYSTEROSCOPY WITH CERVICAL BIOPSY  Patient Location: PACU  Anesthesia Type:General  Level of Consciousness: drowsy and responds to stimulation  Airway & Oxygen Therapy: Patient Spontanous Breathing and Patient connected to face mask oxygen  Post-op Assessment: Report given to RN and Post -op Vital signs reviewed and stable  Post vital signs: Reviewed and stable  Last Vitals:  Vitals Value Taken Time  BP 111/63 04/13/22 1345  Temp    Pulse 84 04/13/22 1349  Resp 42 04/13/22 1349  SpO2 95 % 04/13/22 1349  Vitals shown include unvalidated device data.  Last Pain:  Vitals:   04/13/22 0927  TempSrc: Oral  PainSc: 0-No pain         Complications: No notable events documented.

## 2022-04-13 NOTE — Interval H&P Note (Signed)
History and Physical Interval Note:  04/13/2022 11:39 AM  Andrea Lang  has presented today for surgery, with the diagnosis of lump of cervix.  The various methods of treatment have been discussed with the patient and family. After consideration of risks, benefits and other options for treatment, the patient has consented to  Procedure(s): [place] (N/A) as a surgical intervention.  The patient's history has been reviewed, patient examined, no change in status, stable for surgery.  I have reviewed the patient's chart and labs.  Questions were answered to the patient's satisfaction.     [name]

## 2022-04-13 NOTE — Anesthesia Procedure Notes (Signed)
Procedure Name: LMA Insertion Date/Time: 04/13/2022 12:35 PM  Performed by: Rogers Blocker, CRNAPre-anesthesia Checklist: Patient identified, Emergency Drugs available, Suction available and Patient being monitored Patient Re-evaluated:Patient Re-evaluated prior to induction Oxygen Delivery Method: Circle System Utilized Preoxygenation: Pre-oxygenation with 100% oxygen Induction Type: IV induction Ventilation: Mask ventilation without difficulty LMA: LMA inserted LMA Size: 4.0 Number of attempts: 1 Airway Equipment and Method: Bite block Placement Confirmation: positive ETCO2 Tube secured with: Tape Dental Injury: Teeth and Oropharynx as per pre-operative assessment

## 2022-04-13 NOTE — Discharge Instructions (Signed)
No acetaminophen/Tylenol until after 3:30pm today if needed for pain.    DISCHARGE INSTRUCTIONS: HYSTEROSCOPY / ENDOMETRIAL ABLATION The following instructions have been prepared to help you care for yourself upon your return home.   Personal hygiene:  Use sanitary pads for vaginal drainage, not tampons.  Shower the day after your procedure.  NO tub baths, pools or Jacuzzis for 2-3 weeks.  Wipe front to back after using the bathroom.  Activity and limitations:  Do NOT drive or operate any equipment for 24 hours. The effects of anesthesia are still present and drowsiness may result.  Do NOT rest in bed all day.  Walking is encouraged.  Walk up and down stairs slowly.  You may resume your normal activity in one to two days or as indicated by your physician. Sexual activity: NO intercourse for at least 2 weeks after the procedure, or as indicated by your Doctor.  Diet: Eat a light meal as desired this evening. You may resume your usual diet tomorrow.  Return to Work: You may resume your work activities in one to two days or as indicated by Marine scientist.  What to expect after your surgery: Expect to have vaginal bleeding/discharge for 2-3 days and spotting for up to 10 days. It is not unusual to have soreness for up to 1-2 weeks. You may have a slight burning sensation when you urinate for the first day. Mild cramps may continue for a couple of days. You may have a regular period in 2-6 weeks.  Call your doctor for any of the following:  Excessive vaginal bleeding or clotting, saturating and changing one pad every hour.  Inability to urinate 6 hours after discharge from hospital.  Pain not relieved by pain medication.  Fever of 100.4 F or greater.  Unusual vaginal discharge or odor.     Post Anesthesia Home Care Instructions  Activity: Get plenty of rest for the remainder of the day. A responsible individual must stay with you for 24 hours following the procedure.  For the  next 24 hours, DO NOT: -Drive a car -Paediatric nurse -Drink alcoholic beverages -Take any medication unless instructed by your physician -Make any legal decisions or sign important papers.  Meals: Start with liquid foods such as gelatin or soup. Progress to regular foods as tolerated. Avoid greasy, spicy, heavy foods. If nausea and/or vomiting occur, drink only clear liquids until the nausea and/or vomiting subsides. Call your physician if vomiting continues.  Special Instructions/Symptoms: Your throat may feel dry or sore from the anesthesia or the breathing tube placed in your throat during surgery. If this causes discomfort, gargle with warm salt water. The discomfort should disappear within 24 hours.

## 2022-04-13 NOTE — Brief Op Note (Signed)
04/13/2022  1:39 PM  PATIENT:  Andrea Lang  32 y.o. female  PRE-OPERATIVE DIAGNOSIS:  lump of cervix  POST-OPERATIVE DIAGNOSIS:  lump of cervix  PROCEDURE:  Procedure(s): DILATATION & CURETTAGE/HYSTEROSCOPY WITH CERVICAL BIOPSY (N/A)  SURGEON:  Surgeon(s) and Role:    * Bovard-Stuckert, Zophia Marrone, MD - Primary   ANESTHESIA:   local and general by LMA  EBL:  20 mL   DRAINS: none   LOCAL MEDICATIONS USED:  LIDOCAINE   SPECIMEN:  Source of Specimen:  cervical biopsy  DISPOSITION OF SPECIMEN:  PATHOLOGY  COUNTS:  YES  TOURNIQUET:  * No tourniquets in log *  DICTATION: .Other Dictation: Dictation Number [id number]  PLAN OF CARE: Discharge to home after PACU  PATIENT DISPOSITION:  PACU - hemodynamically stable.   Delay start of Pharmacological VTE agent (>24hrs) due to surgical blood loss or risk of bleeding: not applicable

## 2022-04-14 ENCOUNTER — Encounter (HOSPITAL_BASED_OUTPATIENT_CLINIC_OR_DEPARTMENT_OTHER): Payer: Self-pay | Admitting: Obstetrics and Gynecology

## 2022-04-14 LAB — SURGICAL PATHOLOGY

## 2022-04-14 NOTE — Op Note (Signed)
Andrea Lang, LEWISON MEDICAL RECORD NO: [id number] ACCOUNT NO: [id number] DATE OF BIRTH: May 31, 1990 FACILITY: [facility] LOCATION: [facility] PHYSICIAN: Janyth Contes, MD  Operative Report   DATE OF PROCEDURE: 04/13/2022  PREOPERATIVE DIAGNOSIS:  Lump in cervix noted on ultrasound.  POSTOPERATIVE DIAGNOSIS:  Lump in cervix noted on ultrasound.  PROCEDURE:  Hysteroscopy with cervical biopsy.  SURGEON:  Janyth Contes, MD  ANESTHESIA:  Local and general by LMA.  ESTIMATED BLOOD LOSS:  20 mL.  IV FLUIDS AND URINE OUTPUT:  Per anesthesia records.  COMPLICATIONS:  None.  DESCRIPTION OF PROCEDURE:  After informed consent was reviewed with the patient including risks, benefits and alternatives of surgical procedure she was transported to the operating room and placed on the table in supine position.  General anesthesia was  induced and found to be adequate.  She was prepped and draped in the normal sterile fashion.  She had voided directly before coming to the operating room so we did not drain her bladder.  After an appropriate timeout was performed using an open-sided  speculum her cervix was easily visualized, dilated to 21 [place] to accommodate the 19-French hysteroscope.  Hysteroscope was introduced.  Prior to the surgery her ultrasound had been reviewed noting a mass in her cervix on the left lateral anterior.  Her  cervix was carefully visualized through the hysteroscope.  No mass protruded into the lumen of her cervix.  A brief uterine survey performed revealed normal tubal ostia.  The hysteroscope was removed.  Her cervix was palpated and an approximately 1 cm  lump was palpated.  Her cervix was injected with lidocaine and an incision was made over this area using a hemostat and an Allis.  A biopsy of the tissue was performed after removal of what was thought to be a polyp or polypoid tissue.  Her cervix was  palpated and felt like this area had been removed.  Bleeding  was controlled with deep sutures of 3-0 Vicryl in a running fashion on the surface of the cervix.  The patient tolerated the procedure well.  Sponge, lap and needle count was correct x2 per the  operating staff at the end of the procedure. The patient was awakened in stable condition and transferred to PACU.   PUS D: 04/14/2022 4:40:11 pm T: 04/14/2022 7:22:00 pm  JOB: [id number]

## 2022-04-14 NOTE — Anesthesia Postprocedure Evaluation (Signed)
Anesthesia Post Note  Patient: Andrea Lang  Procedure(s) Performed: DILATATION & CURETTAGE/HYSTEROSCOPY WITH CERVICAL BIOPSY     Patient location during evaluation: PACU Anesthesia Type: General Level of consciousness: awake and alert Pain management: pain level controlled Vital Signs Assessment: post-procedure vital signs reviewed and stable Respiratory status: spontaneous breathing, nonlabored ventilation, respiratory function stable and patient connected to nasal cannula oxygen Cardiovascular status: blood pressure returned to baseline and stable Postop Assessment: no apparent nausea or vomiting Anesthetic complications: no   No notable events documented.  Last Vitals:  Vitals:   04/13/22 1430 04/13/22 1458  BP: 118/70 116/82  Pulse: 68 75  Resp: 13 14  Temp:  (!) 36.3 C  SpO2: 100% 100%    Last Pain:  Vitals:   04/14/22 1028  TempSrc:   PainSc: 5                  Tiajuana Amass

## 2022-05-08 ENCOUNTER — Encounter: Payer: Self-pay | Admitting: Pulmonary Disease

## 2022-05-08 ENCOUNTER — Ambulatory Visit (INDEPENDENT_AMBULATORY_CARE_PROVIDER_SITE_OTHER): Payer: Medicaid Other | Admitting: Pulmonary Disease

## 2022-05-08 VITALS — BP 112/70 | HR 76 | Ht 66.0 in | Wt 188.4 lb

## 2022-05-08 DIAGNOSIS — J4520 Mild intermittent asthma, uncomplicated: Secondary | ICD-10-CM

## 2022-05-08 LAB — CBC WITH DIFFERENTIAL/PLATELET
Basophils Absolute: 0 10*3/uL (ref 0.0–0.1)
Basophils Relative: 0.5 % (ref 0.0–3.0)
Eosinophils Absolute: 0.2 10*3/uL (ref 0.0–0.7)
Eosinophils Relative: 3.7 % (ref 0.0–5.0)
HCT: 39.6 % (ref 36.0–46.0)
Hemoglobin: 13.3 g/dL (ref 12.0–15.0)
Lymphocytes Relative: 37.6 % (ref 12.0–46.0)
Lymphs Abs: 2.5 10*3/uL (ref 0.7–4.0)
MCHC: 33.5 g/dL (ref 30.0–36.0)
MCV: 86.7 fl (ref 78.0–100.0)
Monocytes Absolute: 0.5 10*3/uL (ref 0.1–1.0)
Monocytes Relative: 7.9 % (ref 3.0–12.0)
Neutro Abs: 3.4 10*3/uL (ref 1.4–7.7)
Neutrophils Relative %: 50.3 % (ref 43.0–77.0)
Platelets: 256 10*3/uL (ref 150.0–400.0)
RBC: 4.57 Mil/uL (ref 3.87–5.11)
RDW: 13.2 % (ref 11.5–15.5)
WBC: 6.7 10*3/uL (ref 4.0–10.5)

## 2022-05-08 LAB — NITRIC OXIDE: Nitric Oxide: 11

## 2022-05-08 NOTE — Patient Instructions (Signed)
Will check CBC with differential and IgE today Schedule PFTs Return to clinic at next available after PFTs.

## 2022-05-08 NOTE — Progress Notes (Signed)
Andrea Lang    [id number]    Jun 05, 1990  Primary Care Physician:Jones, Letha Cape, NP  Referring Physician: Iona Hansen, NP [address] [place],  [place] [address]  Chief complaint: Consult for evaluation of asthma  HPI: 32 y.o. who  has a past medical history of Allergy, Anxiety, Asthma, GERD (gastroesophageal reflux disease), Helicobacter pylori gastritis, Seasonal allergies, Sinusitis, Tubular adenoma of colon, and VBAC, delivered, current hospitalization (12/09/2018).   She has been referred from her PCP for possible asthma.  She has not been diagnosed with asthma before but has some wheezing and shortness of breath especially during pollen season.  She has an albuterol inhaler which she uses during pollen season in spring and summertime but does not require maintenance inhaler.  She is still seeing allergist in the past and was getting allergy shots but stopped around 2020  Evaluated in the ED in January 2024 with atypical chest pain with negative EKG, troponin and chest x-ray was clear.  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  No feather pillows or exposures Smoking history: Never smoker Travel history: No significant travel history Relevant family history: Dad has asthma   Outpatient Encounter Medications as of 05/08/2022  Medication Sig   albuterol (VENTOLIN HFA) 108 (90 Base) MCG/ACT inhaler 90 puffs.   cetirizine (ZYRTEC) 10 MG tablet Take 10 mg by mouth daily.   Multiple Vitamin (MULTIVITAMIN) tablet Take 1 tablet by mouth daily.   [DISCONTINUED] EPINEPHrine (EPIPEN 2-PAK) 0.3 mg/0.3 mL IJ SOAJ injection Inject 0.3 mLs (0.3 mg total) into the muscle once as needed for up to 1 dose. (Patient not taking: Reported on 08/14/2020)   [DISCONTINUED] oxyCODONE (ROXICODONE) 5 MG immediate release tablet Take 1 tablet (5 mg total) by mouth every 6 (six) hours as needed for severe pain.   No  facility-administered encounter medications on file as of 05/08/2022.    Allergies as of 05/08/2022 - Review Complete 05/08/2022  Allergen Reaction Noted   Other Swelling 09/08/2017   Aspirin  05/22/2014   Avocado Swelling 01/18/2014   Banana Swelling 01/18/2014   Food Swelling 03/28/2011   Kiwi extract Swelling 01/18/2014   Mangifera indica Swelling 01/18/2014   Nsaids Other (See Comments) 05/22/2014   Latex Rash 01/27/2011    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; greater than 50 tubular adenomas in 2016   VBAC, delivered, current hospitalization 12/09/2018    Past Surgical History:  Procedure Laterality Date   CESAREAN SECTION  2009   COLONOSCOPY  2016   JMP-MAC-polyposis syndrome/multiple polyps (TA x 5)   DILATATION & CURETTAGE/HYSTEROSCOPY WITH MYOSURE N/A 04/13/2022   Procedure: DILATATION & CURETTAGE/HYSTEROSCOPY WITH CERVICAL BIOPSY;  Surgeon: Sherian Rein, MD;  Location: [facility];  Service: Gynecology;  Laterality: N/A;   FLEXIBLE SIGMOIDOSCOPY  2019   JMP-MAC-good prep-patent end side to side ileocolonic anastomosis with inflammation   LAPAROSCOPIC PARTIAL COLECTOMY N/A 06/20/2014   Procedure: LAPAROSCOPIC TOTAL ABDOMINAL  COLECTOMY;  Surgeon: Romie Levee, MD;  Location: [facility];  Service:  General;  Laterality: N/A;   POLYPECTOMY  2016   poypoid polyps/TA x 5   SIGMOIDOSCOPY  09/04/2020   Dr.Pyrtle   TONSILLECTOMY      Family History  Problem Relation Age of Onset   Colon cancer Neg Hx    Esophageal cancer Neg Hx    Rectal cancer Neg Hx    Stomach cancer Neg Hx    Colon polyps Neg Hx     Social History   Socioeconomic History    Marital status: Married    Spouse name: Not on file   Number of children: 2   Years of education: Not on file   Highest education level: Not on file  Occupational History   Occupation: student  Tobacco Use   Smoking status: Never   Smokeless tobacco: Never  Vaping Use   Vaping Use: Never used  Substance and Sexual Activity   Alcohol use: No    Alcohol/week: 0.0 standard drinks of alcohol   Drug use: No   Sexual activity: Yes    Birth control/protection: Condom  Other Topics Concern   Not on file  Social History Narrative   Not on file   Social Determinants of Health   Financial Resource Strain: Not on file  Food Insecurity: Not on file  Transportation Needs: Not on file  Physical Activity: Not on file  Stress: Not on file  Social Connections: Not on file  Intimate Partner Violence: Not on file    Review of systems: Review of Systems  Constitutional: Negative for fever and chills.  HENT: Negative.   Eyes: Negative for blurred vision.  Respiratory: as per HPI  Cardiovascular: Negative for chest pain and palpitations.  Gastrointestinal: Negative for vomiting, diarrhea, blood per rectum. Genitourinary: Negative for dysuria, urgency, frequency and hematuria.  Musculoskeletal: Negative for myalgias, back pain and joint pain.  Skin: Negative for itching and rash.  Neurological: Negative for dizziness, tremors, focal weakness, seizures and loss of consciousness.  Endo/Heme/Allergies: Negative for environmental allergies.  Psychiatric/Behavioral: Negative for depression, suicidal ideas and hallucinations.  All other systems reviewed and are negative.  Physical Exam: Blood pressure 112/70, pulse 76, height 5\' 6"  (1.676 m), weight 188 lb 6.4 oz (85.5 kg), last menstrual period 03/24/2022, SpO2 100 %, unknown if currently breastfeeding. Gen:      No acute distress HEENT:  EOMI, sclera anicteric Neck:     No masses; no thyromegaly Lungs:    Clear to auscultation  bilaterally; normal respiratory effort CV:         Regular rate and rhythm; no murmurs Abd:      + bowel sounds; soft, non-tender; no palpable masses, no distension Ext:    No edema; adequate peripheral perfusion Skin:      Warm and dry; no rash Neuro: alert and oriented x 3 Psych: normal mood and affect  Data Reviewed: Imaging: Chest x-ray 02/05/2022-no active cardiopulmonary disease I have reviewed the images personally.  PFTs: 01-[birth year] FEV1 3.63 [107%], FVC 4.61 [111%], F/F87 Normal spirometry  FENO 05/08/2022 - 11  Labs:  Assessment:  Assessment for asthma Appears to have very mild symptoms and does not need controller medication.  Needs to use rescue inhaler only during pollen season Check CBC differential, IgE Schedule PFTs  Plan/Recommendations: CBC, IgE, PFTs  Chilton Greathouse MD [facility] 05/08/2022, 11:25 AM  CC: Iona Hansen, NP

## 2022-05-11 LAB — IGE: IgE (Immunoglobulin E), Serum: 95 kU/L (ref <=114)

## 2022-06-04 ENCOUNTER — Encounter (HOSPITAL_BASED_OUTPATIENT_CLINIC_OR_DEPARTMENT_OTHER): Payer: Medicaid Other

## 2022-06-05 ENCOUNTER — Ambulatory Visit: Payer: Medicaid Other | Admitting: Pulmonary Disease

## 2022-07-07 ENCOUNTER — Encounter (HOSPITAL_BASED_OUTPATIENT_CLINIC_OR_DEPARTMENT_OTHER): Payer: Self-pay

## 2022-07-13 ENCOUNTER — Ambulatory Visit (INDEPENDENT_AMBULATORY_CARE_PROVIDER_SITE_OTHER): Payer: Medicaid Other | Admitting: Pulmonary Disease

## 2022-07-13 DIAGNOSIS — J4520 Mild intermittent asthma, uncomplicated: Secondary | ICD-10-CM | POA: Diagnosis not present

## 2022-07-13 LAB — PULMONARY FUNCTION TEST
DL/VA % pred: 117 %
DL/VA: 5.31 ml/min/mmHg/L
DLCO cor % pred: 128 %
DLCO cor: 30.89 ml/min/mmHg
DLCO unc % pred: 128 %
DLCO unc: 30.89 ml/min/mmHg
FEF 25-75 Post: 1.23 L/sec
FEF 25-75 Pre: 0.96 L/sec
FEF2575-%Change-Post: 28 %
FEF2575-%Pred-Post: 34 %
FEF2575-%Pred-Pre: 26 %
FEV1-%Change-Post: 11 %
FEV1-%Pred-Post: 54 %
FEV1-%Pred-Pre: 49 %
FEV1-Post: 1.85 L
FEV1-Pre: 1.67 L
FEV1FVC-%Change-Post: 16 %
FEV1FVC-%Pred-Pre: 43 %
FEV6-%Change-Post: -3 %
FEV6-%Pred-Post: 108 %
FEV6-%Pred-Pre: 112 %
FEV6-Post: 4.34 L
FEV6-Pre: 4.5 L
FEV6FVC-%Change-Post: 0 %
FEV6FVC-%Pred-Post: 100 %
FEV6FVC-%Pred-Pre: 100 %
FVC-%Change-Post: -4 %
FVC-%Pred-Post: 107 %
FVC-%Pred-Pre: 112 %
FVC-Post: 4.34 L
FVC-Pre: 4.54 L
Post FEV1/FVC ratio: 43 %
Post FEV6/FVC ratio: 100 %
Pre FEV1/FVC ratio: 37 %
Pre FEV6/FVC Ratio: 99 %
RV % pred: 103 %
RV: 1.54 L
TLC % pred: 114 %
TLC: 6.12 L

## 2022-07-13 NOTE — Progress Notes (Signed)
Full PFT performed today. °

## 2022-07-13 NOTE — Patient Instructions (Signed)
Full PFT performed today. °

## 2022-07-16 ENCOUNTER — Encounter (HOSPITAL_BASED_OUTPATIENT_CLINIC_OR_DEPARTMENT_OTHER): Payer: Medicaid Other

## 2022-07-17 ENCOUNTER — Ambulatory Visit: Payer: Medicaid Other | Admitting: Pulmonary Disease

## 2022-08-03 ENCOUNTER — Ambulatory Visit: Payer: Medicaid Other | Admitting: Pulmonary Disease

## 2022-09-09 ENCOUNTER — Ambulatory Visit: Payer: Medicaid Other | Admitting: Pulmonary Disease
